# Patient Record
Sex: Male | Born: 1965 | Race: White | Hispanic: No | Marital: Married | Smoking: Never smoker
Health system: Southern US, Community
[De-identification: ages and names within clinical notes are randomized; demographics above are authoritative.]

## PROBLEM LIST (undated history)

## (undated) DIAGNOSIS — T7840XA Allergy, unspecified, initial encounter: Secondary | ICD-10-CM

## (undated) DIAGNOSIS — E785 Hyperlipidemia, unspecified: Secondary | ICD-10-CM

## (undated) DIAGNOSIS — J45909 Unspecified asthma, uncomplicated: Secondary | ICD-10-CM

## (undated) DIAGNOSIS — K200 Eosinophilic esophagitis: Secondary | ICD-10-CM

## (undated) DIAGNOSIS — K219 Gastro-esophageal reflux disease without esophagitis: Secondary | ICD-10-CM

## (undated) DIAGNOSIS — J189 Pneumonia, unspecified organism: Secondary | ICD-10-CM

## (undated) HISTORY — DX: Allergy, unspecified, initial encounter: T78.40XA

## (undated) HISTORY — PX: VASECTOMY: SHX75

## (undated) HISTORY — DX: Gastro-esophageal reflux disease without esophagitis: K21.9

## (undated) HISTORY — DX: Pneumonia, unspecified organism: J18.9

## (undated) HISTORY — DX: Unspecified asthma, uncomplicated: J45.909

## (undated) HISTORY — DX: Hyperlipidemia, unspecified: E78.5

## (undated) HISTORY — PX: UPPER GASTROINTESTINAL ENDOSCOPY: SHX188

## (undated) HISTORY — DX: Eosinophilic esophagitis: K20.0

---

## 2009-11-06 ENCOUNTER — Ambulatory Visit: Payer: Self-pay | Admitting: Family Medicine

## 2009-11-06 DIAGNOSIS — M2170 Unequal limb length (acquired), unspecified site: Secondary | ICD-10-CM

## 2009-11-06 DIAGNOSIS — M25569 Pain in unspecified knee: Secondary | ICD-10-CM | POA: Insufficient documentation

## 2009-11-06 DIAGNOSIS — M629 Disorder of muscle, unspecified: Secondary | ICD-10-CM

## 2010-10-13 ENCOUNTER — Ambulatory Visit: Payer: Self-pay | Admitting: Sports Medicine

## 2011-01-05 NOTE — Assessment & Plan Note (Signed)
Summary: IT BAND PAIN,MC   Vital Signs:  Patient profile:   45 year old male Height:      73 inches Weight:      164 pounds BMI:     21.72 BP sitting:   122 / 80  Vitals Entered By: Lillia Pauls CMA (October 13, 2010 1:54 PM)   History of Present Illness: 45yo male to office w/ c/o R ITB pain. Started 09/25/10 while doing a speed workout, supposed to do 6-miles, but legs started tightening after 3 miles.  Stopped b/c gait was changing. Continues to run - running up to 6.5 miles (11/1) but was getting tight, averaging 3-mile runs at most.  Running 9-10 miles/wk presently, about 20-25 miles/wk prior to injury. Doing some ITB stretches recommended to him last year, but only started recently. He has decerased running pace & is trying to run on more level surfaces. Not taking any medications for this. Training for 1/2 marathon in [place] that is this saturday. Similar ITB pain last year & was fitted with sports insoles with lift in R shoe for leg-length difference.  Still using insoles. Denies numbness/tingling. Denies any mechanical symptoms in knees.  Preventive Screening-Counseling & Management  Alcohol-Tobacco     Smoking Status: never  Allergies (verified): No Known Drug Allergies  Social History: Smoking Status:  never  Review of Systems      See HPI  Physical Exam  General:  Well-developed,well-nourished,in no acute distress; alert,appropriate and cooperative throughout examination Msk:  KNEE: - R knee: no gross deformity.  No swelling or effusion. TTP along distal & middle ITB.  No TTP along medial/lateral joint lines, patella, patellar tendon, quad tendon. Ligaments stable.  Neg McMurrays. Neg patellar apprehension & patellar grind. (+)Ober test with tight ITB - L knee exam unremarkable, no tenderness or tightness along ITB  HIPS:  good ROM b/l.  Good hip strength b/l.  GAIT:  R leg 1.5 cm shorter than left.  Running gait reveals forefoot striker, overall  good running form.  No limp.  Neurovascularly intact distally.   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  Given recurrance of his symptoms, would benefit from [facility] of these exercises when feeling better - Foam roller before & after workouts to help loosen area - Ice massage after workouts - Ok to continue running as long as not limping or changing gait.  Gradually increase distance & intensity of runs.  Recommend running on level surfaces - f/u if not improving, otherwise prn  Orders: Sports Insoles (L3510) Foot Orthosis ( Arch Strap/Heel Cup) [phone])  Problem # 2:  UNEQUAL LEG LENGTH (ICD-736.81)  - R leg 1.5 cm short - unchanged from previous encounters - Fitted with new set of sports insoles with heel lift in R shoe.  Cont to use these in all of his shoes.  Orders: Sports Insoles [phone]) Foot Orthosis ( Arch Strap/Heel Cup) [phone])   Orders Added: 1)  Est. Patient Level III [95621] 2)  Sports Insoles [L3510] 3)  Foot Orthosis ( Arch Strap/Heel Cup) [H0865]

## 2012-01-11 ENCOUNTER — Ambulatory Visit (INDEPENDENT_AMBULATORY_CARE_PROVIDER_SITE_OTHER): Payer: Managed Care, Other (non HMO) | Admitting: Sports Medicine

## 2012-01-11 VITALS — BP 110/70

## 2012-01-11 DIAGNOSIS — M629 Disorder of muscle, unspecified: Secondary | ICD-10-CM

## 2012-01-11 DIAGNOSIS — M2170 Unequal limb length (acquired), unspecified site: Secondary | ICD-10-CM

## 2012-01-11 MED ORDER — KETOPROFEN POWD: Status: DC

## 2012-01-11 NOTE — Assessment & Plan Note (Addendum)
Ketoprofen gel topically TID as well as massaged ice TID. Will also place in compression sleeve to ITB area to help with swelling. Will follow up in 6 weeks if not better.  In observing his right it seems that we have been slightly overcorrected with the lift on the right sports insole.  I changed the test by placing a tapered lift and we placed the thicker portion to the outside of the foot. Running gait in this shows less supination of his right foot.  We will try this correction to see if it helps.

## 2012-01-11 NOTE — Progress Notes (Signed)
  Subjective:    Patient ID: Gabriel Greer, male    DOB: [birth year], 46 y.o.   MRN: [id number]  HPI Patient presents today with chief complaint of recurrence of the iliotibial band syndrome on the right. Patient states that he [place] approximately 2 weeks ago and has noticed severe lateral knee pain since but not too much during the marathon. Pt states that he resumed running 3-4 days after marathon and was unable to run >2 miles without developing severe lateral knee pain. Pt was previously able to run >10 miles at a time without pain prior to marathon.  Pt does report some subtle R medial hip pain associated with training for marathon. This has since resolved per pt. No referred back or ankle pain per pt.  Pt has a baseline hx/o of leg length discepancy that he wears corrected sports insole for. Pt states that he used these orthotics during his recent marathon.   Review of Systems See HPI, otherwise ROS negative     Objective:   Physical Exam NAD  knee exam shows no effusion; stable ligaments; negative Mcmurray's and provocative meniscal tests; crepitation on patellar compression; patellar tendon unremarkable. Noted mild swelling and TTP along lateral insertion of iliotibial band at gerdy's tubercle.  Hip with full ROM bilaterally, no noted pain with internal/external rotation of hips bilaterally.  Noted leg discrepancy of approx 1.5 cm (L>R) Noted mild R foot supination with running gait.   Excellent overall hip and quad strength       Assessment & Plan:

## 2012-01-11 NOTE — Assessment & Plan Note (Addendum)
R leg affected side. Orthotic padding corrected to help with foot supination.   Use tapered lift this time to correct supination and increase aboput 1 cm

## 2012-01-11 NOTE — Patient Instructions (Signed)
Continue with the knee and hip exercises previously given Wear the knee sleeve daily Rub ice over the iliotibial band daily  Rub the ketoprofen gel over the iliotibial band 2-3 times per day If your symptoms dont improve over the next 6 weeks, come back in for a follow up appointment.   Doree Albee MD

## 2014-01-29 ENCOUNTER — Ambulatory Visit (INDEPENDENT_AMBULATORY_CARE_PROVIDER_SITE_OTHER): Payer: Managed Care, Other (non HMO) | Admitting: Family Medicine

## 2014-01-29 ENCOUNTER — Ambulatory Visit: Payer: Managed Care, Other (non HMO)

## 2014-01-29 VITALS — BP 116/82 | HR 94 | Temp 97.4°F | Resp 18 | Ht 73.5 in | Wt 190.6 lb

## 2014-01-29 DIAGNOSIS — R0989 Other specified symptoms and signs involving the circulatory and respiratory systems: Secondary | ICD-10-CM

## 2014-01-29 DIAGNOSIS — R6889 Other general symptoms and signs: Secondary | ICD-10-CM

## 2014-01-29 DIAGNOSIS — K219 Gastro-esophageal reflux disease without esophagitis: Secondary | ICD-10-CM

## 2014-01-29 LAB — POCT CBC
Granulocyte percent: 64.9 %G (ref 37–80)
HEMATOCRIT: 50.9 % (ref 43.5–53.7)
HEMOGLOBIN: 16.2 g/dL (ref 14.1–18.1)
LYMPH, POC: 1.9 (ref 0.6–3.4)
MCH: 28.1 pg (ref 27–31.2)
MCHC: 31.8 g/dL (ref 31.8–35.4)
MCV: 88.4 fL (ref 80–97)
MID (cbc): 0.4 (ref 0–0.9)
MPV: 7.8 fL (ref 0–99.8)
POC Granulocyte: 4.3 (ref 2–6.9)
POC LYMPH PERCENT: 28.7 %L (ref 10–50)
POC MID %: 6.4 %M (ref 0–12)
Platelet Count, POC: 236 10*3/uL (ref 142–424)
RBC: 5.76 M/uL (ref 4.69–6.13)
RDW, POC: 13.6 %
WBC: 6.7 10*3/uL (ref 4.6–10.2)

## 2014-01-29 MED ORDER — OMEPRAZOLE 20 MG PO CPDR: 20.0000 mg | DELAYED_RELEASE_CAPSULE | Freq: Every day | ORAL | Status: DC

## 2014-01-29 NOTE — Progress Notes (Signed)
Subjective:    Patient ID: Gabriel Greer, male    DOB: 1966-06-23, 48 y.o.   MRN: [id number]  01/29/2014  feels like a gas bubble in throat   HPI This 48 y.o. male presents for evaluation of pressure/gas bubble in throat while eating.  Two months ago, was eating grilled chicken salad at lunch; with first bite of salad, felt severe pressure sensation in throat that felt like it was suffocating patient.  Duration of symptoms ten minutes.  Had frequent belching during episode with improvement in symptoms.  Chicken did not get stuck. Was having difficulty breathing, talking, swallowing.    Had similar more severe episode tonight. Had just started eating steak with acute onset of severe pressure in throat that again felt like it was suffocating patient. Wife and children witnessed event tonight.  Duration of symptoms one hour.  Frequent belching again occurred; pt also spit up frothy white clear liquid.  Upon arriving to the clinic, walking around seemed to help symptoms subside. Now asymptomatic for the past thirty minutes.  Denies fever/chills/sweats.  No headache, ear pain, sore throat.  No rhinorrhea; does suffer with chronic nasal congestion and PND; does not take anything for chronic congestion.  No tongue swelling, facial swelling, or throat swelling.  No cough.  No n/v/d/c; no abdominal pain. No frequent GERD, heartburn, indigestion.  No associated chest pain, neck pain, arm pain. No focal weakness, paresthesias, slurred speech, difficulties with fine motor skills. No family history of early CAD disease.  Wife does report major work stressors.  Symptoms could be stress related.  Was previously running three days per week but has recently stopped exercising.     Review of Systems  Constitutional: Negative for fever, chills, diaphoresis, activity change, appetite change, fatigue and unexpected weight change.  HENT: Positive for congestion, postnasal drip and trouble swallowing. Negative for ear  pain, facial swelling, sore throat and voice change.   Respiratory: Positive for choking and shortness of breath. Negative for cough, wheezing and stridor.   Cardiovascular: Negative for chest pain, palpitations and leg swelling.  Gastrointestinal: Negative for vomiting, abdominal pain, diarrhea, constipation and abdominal distention.  Psychiatric/Behavioral: Negative for dysphoric mood. The patient is not nervous/anxious.     Past Medical History  Diagnosis Date  . Allergy   . Asthma    No Known Allergies Current Outpatient Prescriptions  Medication Sig Dispense Refill  . omeprazole (PRILOSEC) 20 MG capsule Take 1 capsule (20 mg total) by mouth daily.  30 capsule  3   No current facility-administered medications for this visit.   History   Social History  . Marital Status: Married    Spouse Name: N/A    Number of Children: N/A  . Years of Education: N/A   Occupational History  . Not on file.   Social History Main Topics  . Smoking status: Never Smoker   . Smokeless tobacco: Not on file  . Alcohol Use: No  . Drug Use: No  . Sexual Activity: Not on file   Other Topics Concern  . Not on file   Social History Narrative  . No narrative on file   Family History  Problem Relation Age of Onset  . Hyperlipidemia Mother   . Hypertension Mother   . Hyperlipidemia Father   . Hypertension Father   . Mental illness Father        Objective:    BP 116/82  Pulse 94  Temp(Src) 97.4 F (36.3 C) (Oral)  Resp 18  Ht 6' 1.5" (1.867 m)  Wt 190 lb 9.6 oz (86.456 kg)  BMI 24.80 kg/m2  SpO2 99% Physical Exam  Constitutional: He is oriented to person, place, and time. He appears well-developed and well-nourished. No distress.  HENT:  Head: Normocephalic and atraumatic.  Right Ear: External ear normal.  Left Ear: External ear normal.  Nose: Nose normal.  Mouth/Throat: Oropharynx is clear and moist.  Eyes: Conjunctivae and EOM are normal. Pupils are equal, round, and  reactive to light.  Neck: Normal range of motion. Neck supple. No JVD present. No tracheal deviation present. No thyromegaly present.  Cardiovascular: Normal rate, regular rhythm and normal heart sounds.  Exam reveals no gallop and no friction rub.   No murmur heard. Pulmonary/Chest: Effort normal and breath sounds normal. No stridor. No respiratory distress. He has no wheezes. He has no rales.  Abdominal: Soft. Bowel sounds are normal. He exhibits no distension and no mass. There is no tenderness. There is no rebound and no guarding.  Musculoskeletal:       Right shoulder: Normal.       Left shoulder: Normal.       Cervical back: Normal.  Lymphadenopathy:    He has no cervical adenopathy.  Neurological: He is alert and oriented to person, place, and time. He has normal reflexes. No cranial nerve deficit. He exhibits normal muscle tone. Coordination normal.  Skin: Skin is warm and dry. No rash noted. He is not diaphoretic.  Psychiatric: He has a normal mood and affect. His behavior is normal.   Results for orders placed in visit on 01/29/14  POCT CBC      Result Value Ref Range   WBC 6.7  4.6 - 10.2 K/uL   Lymph, poc 1.9  0.6 - 3.4   POC LYMPH PERCENT 28.7  10 - 50 %L   MID (cbc) 0.4  0 - 0.9   POC MID % 6.4  0 - 12 %M   POC Granulocyte 4.3  2 - 6.9   Granulocyte percent 64.9  37 - 80 %G   RBC 5.76  4.69 - 6.13 M/uL   Hemoglobin 16.2  14.1 - 18.1 g/dL   HCT, POC 16.1  09.6 - 53.7 %   MCV 88.4  80 - 97 fL   MCH, POC 28.1  27 - 31.2 pg   MCHC 31.8  31.8 - 35.4 g/dL   RDW, POC 04.5     Platelet Count, POC 236  142 - 424 K/uL   MPV 7.8  0 - 99.8 fL   [facility] reading (PRIMARY) by  Dr. Katrinka Blazing.  CXR: NAD  Soft tissue neck: NAD   RANITIDINE 150MG  PO ADMINISTERED IN OFFICE.    Assessment & Plan:  Choking sensation - Plan: DG Chest 2 View, DG Neck Soft Tissue, POCT CBC, Comprehensive metabolic panel, Ambulatory referral to Gastroenterology  Esophageal reflux  1. Choking/suffocation  sensation with eating:  New; two episodes in the past two months; associated with reflux/belching, attempts at vomiting.  Denies dysphagia or food getting stuck.  Ddx atypical presentation of GERD, esophageal spasm, candidiasis esophageal, stress.  Refer to GI due to recurrence and to rule out pathology.  Rx for Omeprazole 20mg  every morning. Advised to also start Zantac 150mg  qhs.  To ED for recurrence. 2. GERD:  New.  Rx for Omeprazole 20mg  provided to take until evaluated by GI.  Also recommend adding Zantac 150mg  qhs.  Meds ordered this encounter  Medications  . omeprazole (PRILOSEC) 20  MG capsule    Sig: Take 1 capsule (20 mg total) by mouth daily.    Dispense:  30 capsule    Refill:  3    No Follow-up on file.   Nilda SimmerKristi Jesicca Dipierro, M.D.  [facility]  [place] [address] [place], [place]  [id number] [phone] phone [phone] fax

## 2014-01-29 NOTE — Patient Instructions (Signed)
1. Take Prilosec one every morning 30 minutes to 1 hour before eating. 2. Take Zantac/Ranitidine 150mg  one tablet at bedtime.    Gastroesophageal Reflux Disease, Adult Gastroesophageal reflux disease (GERD) happens when acid from your stomach flows up into the esophagus. When acid comes in contact with the esophagus, the acid causes soreness (inflammation) in the esophagus. Over time, GERD may create small holes (ulcers) in the lining of the esophagus. CAUSES   Increased body weight. This puts pressure on the stomach, making acid rise from the stomach into the esophagus.  Smoking. This increases acid production in the stomach.  Drinking alcohol. This causes decreased pressure in the lower esophageal sphincter (valve or ring of muscle between the esophagus and stomach), allowing acid from the stomach into the esophagus.  Late evening meals and a full stomach. This increases pressure and acid production in the stomach.  A malformed lower esophageal sphincter. Sometimes, no cause is found. SYMPTOMS   Burning pain in the lower part of the mid-chest behind the breastbone and in the mid-stomach area. This may occur twice a week or more often.  Trouble swallowing.  Sore throat.  Dry cough.  Asthma-like symptoms including chest tightness, shortness of breath, or wheezing. DIAGNOSIS  Your caregiver may be able to diagnose GERD based on your symptoms. In some cases, X-rays and other tests may be done to check for complications or to check the condition of your stomach and esophagus. TREATMENT  Your caregiver may recommend over-the-counter or prescription medicines to help decrease acid production. Ask your caregiver before starting or adding any new medicines.  HOME CARE INSTRUCTIONS   Change the factors that you can control. Ask your caregiver for guidance concerning weight loss, quitting smoking, and alcohol consumption.  Avoid foods and drinks that make your symptoms worse, such  as:  Caffeine or alcoholic drinks.  Chocolate.  Peppermint or mint flavorings.  Garlic and onions.  Spicy foods.  Citrus fruits, such as oranges, lemons, or limes.  Tomato-based foods such as sauce, chili, salsa, and pizza.  Fried and fatty foods.  Avoid lying down for the 3 hours prior to your bedtime or prior to taking a nap.  Eat small, frequent meals instead of large meals.  Wear loose-fitting clothing. Do not wear anything tight around your waist that causes pressure on your stomach.  Raise the head of your bed 6 to 8 inches with wood blocks to help you sleep. Extra pillows will not help.  Only take over-the-counter or prescription medicines for pain, discomfort, or fever as directed by your caregiver.  Do not take aspirin, ibuprofen, or other nonsteroidal anti-inflammatory drugs (NSAIDs). SEEK IMMEDIATE MEDICAL CARE IF:   You have pain in your arms, neck, jaw, teeth, or back.  Your pain increases or changes in intensity or duration.  You develop nausea, vomiting, or sweating (diaphoresis).  You develop shortness of breath, or you faint.  Your vomit is green, yellow, black, or looks like coffee grounds or blood.  Your stool is red, bloody, or black. These symptoms could be signs of other problems, such as heart disease, gastric bleeding, or esophageal bleeding. MAKE SURE YOU:   Understand these instructions.  Will watch your condition.  Will get help right away if you are not doing well or get worse. Document Released: 09/01/2005 Document Revised: 02/14/2012 Document Reviewed: 06/11/2011 [facility] Patient Information 2014 [employer] [place].

## 2014-01-30 LAB — COMPREHENSIVE METABOLIC PANEL
ALT: 40 U/L (ref 0–53)
AST: 31 U/L (ref 0–37)
Albumin: 3.7 g/dL (ref 3.5–5.2)
Alkaline Phosphatase: 49 U/L (ref 39–117)
BILIRUBIN TOTAL: 0.5 mg/dL (ref 0.2–1.2)
BUN: 19 mg/dL (ref 6–23)
CALCIUM: 8.9 mg/dL (ref 8.4–10.5)
CHLORIDE: 105 meq/L (ref 96–112)
CO2: 24 meq/L (ref 19–32)
CREATININE: 0.95 mg/dL (ref 0.50–1.35)
GLUCOSE: 98 mg/dL (ref 70–99)
Potassium: 3.8 mEq/L (ref 3.5–5.3)
Sodium: 138 mEq/L (ref 135–145)
Total Protein: 6.5 g/dL (ref 6.0–8.3)

## 2014-02-18 ENCOUNTER — Encounter: Payer: Self-pay | Admitting: Gastroenterology

## 2014-03-28 ENCOUNTER — Encounter: Payer: Self-pay | Admitting: Gastroenterology

## 2014-03-28 ENCOUNTER — Ambulatory Visit (INDEPENDENT_AMBULATORY_CARE_PROVIDER_SITE_OTHER): Payer: Managed Care, Other (non HMO) | Admitting: Gastroenterology

## 2014-03-28 VITALS — BP 120/80 | HR 79 | Ht 73.5 in | Wt 190.0 lb

## 2014-03-28 DIAGNOSIS — R1310 Dysphagia, unspecified: Secondary | ICD-10-CM

## 2014-03-28 DIAGNOSIS — G4733 Obstructive sleep apnea (adult) (pediatric): Secondary | ICD-10-CM | POA: Insufficient documentation

## 2014-03-28 NOTE — Progress Notes (Signed)
_                                                                                                                History of Present Illness: Pleasant 48 year old white male referred for evaluation of dysphagia.  On at least 2 occasions in the last 4 months he has had an episode of acute onset of  fullness in his upper chest with swallowing solids that may last up to 15 minutes.  On each occasion it was preceded by taking a swallow of cold water.  In between episodes he has been well and denies dysphagia, odynophagia, or pyrosis.  He describes this feeling as an intense pressure in his upper chest that subsides spontaneously but occurs immediately with swallowing.  When the develops the symptoms he has not attempted to use or drink.  He may have some shortness of breath when these symptoms occur and also subsided within 10-15 minutes.  He denies abdominal pain.    Past Medical History  Diagnosis Date  . Allergy   . Asthma   . GERD (gastroesophageal reflux disease)   . Hyperlipidemia   . Pneumonia    Past Surgical History  Procedure Laterality Date  . Vasectomy     family history includes Hyperlipidemia in his father and mother; Hypertension in his father and mother; Mental illness in his father. Current Outpatient Prescriptions  Medication Sig Dispense Refill  . omeprazole (PRILOSEC) 20 MG capsule Take 1 capsule (20 mg total) by mouth daily.  30 capsule  3  . ranitidine (ZANTAC) 150 MG tablet Take 150 mg by mouth daily.       No current facility-administered medications for this visit.   Allergies as of 03/28/2014  . (No Known Allergies)    reports that he has never smoked. He has never used smokeless tobacco. He reports that he does not drink alcohol or use illicit drugs.     Review of Systems: His wife tells him that he snores very loudly.  He claims to awaken at night sometimes gasping for breath and with a rapid pulse.  Pertinent positive and negative review  of systems were noted in the above HPI section. All other review of systems were otherwise negative.  Vital signs were reviewed in today's medical record Physical Exam: General: Well developed , well nourished, no acute distress Skin: anicteric Head: Normocephalic and atraumatic Eyes:  sclerae anicteric, EOMI Ears: Normal auditory acuity Mouth: No deformity or lesions Neck: Supple, no masses or thyromegaly Lungs: Clear throughout to auscultation Heart: Regular rate and rhythm; no murmurs, rubs or bruits Abdomen: Soft, non tender and non distended. No masses, hepatosplenomegaly or hernias noted. Normal Bowel sounds Rectal:deferred Musculoskeletal: Symmetrical with no gross deformities  Skin: No lesions on visible extremities Pulses:  Normal pulses noted Extremities: No clubbing, cyanosis, edema or deformities noted Neurological: Alert oriented x 4, grossly nonfocal Cervical Nodes:  No significant cervical adenopathy Inguinal Nodes: No significant inguinal adenopathy Psychological:  Alert and cooperative. Normal mood and affect  See Assessment and Plan under Problem List

## 2014-03-28 NOTE — Patient Instructions (Signed)
You have been scheduled for an endoscopy with propofol. Please follow written instructions given to you at your visit today. If you use inhalers (even only as needed), please bring them with you on the day of your procedure. Your physician has requested that you go to www.startemmi.com and enter the access code given to you at your visit today. This web site gives a general overview about your procedure. However, you should still follow specific instructions given to you by our office regarding your preparation for the procedure. Your appointment with Pulmonary is scheduled on :  Dr Shelle Ironlance on May 18th at 3:45pm arrive at 3:30pm

## 2014-03-28 NOTE — Assessment & Plan Note (Signed)
Symptoms of intense snoring followed by awakening short of breath raises the question of obstructive sleep apnea.  Recommendations #1 pulmonary referral

## 2014-03-28 NOTE — Assessment & Plan Note (Signed)
The patient's episodes raise the question of dysphagia due to either a fixed stricture or esophageal spasm.  Zenker's diverticulum is less likely since symptoms are very intermittent.  Recommendations #1 upper endoscopy with dilation as indicated

## 2014-04-03 ENCOUNTER — Encounter: Payer: Self-pay | Admitting: Gastroenterology

## 2014-04-03 ENCOUNTER — Ambulatory Visit (AMBULATORY_SURGERY_CENTER): Payer: Managed Care, Other (non HMO) | Admitting: Gastroenterology

## 2014-04-03 VITALS — BP 129/75 | HR 79 | Temp 96.8°F | Resp 14 | Ht 73.5 in | Wt 190.0 lb

## 2014-04-03 DIAGNOSIS — K200 Eosinophilic esophagitis: Secondary | ICD-10-CM

## 2014-04-03 DIAGNOSIS — K222 Esophageal obstruction: Secondary | ICD-10-CM

## 2014-04-03 DIAGNOSIS — R1310 Dysphagia, unspecified: Secondary | ICD-10-CM

## 2014-04-03 MED ORDER — SODIUM CHLORIDE 0.9 % IV SOLN: 500.0000 mL | INTRAVENOUS | Status: DC

## 2014-04-03 NOTE — Progress Notes (Signed)
Knute NeuJ Mullins CRNA is aware of pt's egg allergy

## 2014-04-03 NOTE — Patient Instructions (Addendum)

## 2014-04-03 NOTE — Op Note (Signed)
[facility] [address] [place], [id number]   ENDOSCOPY PROCEDURE REPORT  PATIENT: Gabriel MollyCox, Gabriel T.  MR#: [id number] BIRTHDATE: 07-11-1966 , 47  yrs. old GENDER: Male ENDOSCOPIST: Louis Meckelobert D Kaplan, MD REFERRED BY: PROCEDURE DATE:  04/03/2014 PROCEDURE:  EGD w/ biopsy ASA CLASS:     Class II INDICATIONS:  Dysphagia. MEDICATIONS: MAC sedation, administered by CRNA, Versed 5 mg IV, Fentanyl 100 mcg IV, and Simethicone 0.6cc PO TOPICAL ANESTHETIC: Cetacaine Spray  DESCRIPTION OF PROCEDURE: After the risks benefits and alternatives of the procedure were thoroughly explained, informed consent was obtained.  The LB [id number] endoscope was introduced through the mouth and advanced to the third portion of the duodenum. Without limitations.  The instrument was slowly withdrawn as the mucosa was fully examined.      There was a moderate stricture at the GE junction.  The 9 mm gastroscope easily traversed the stricture. Throughout the esophagus there appeared to be multiple longitudinal for furrows.  Circular rings were seen as well.  Biopsies were taken throughout the esophagus to rule out eosinophilic esophagitis.   The remainder of the upper endoscopy exam was otherwise normal.  Retroflexed views revealed no abnormalities. The scope was then withdrawn from the patient and the procedure completed.  COMPLICATIONS: There were no complications. ENDOSCOPIC IMPRESSION: 1.   esophageal stricture 2.  possible eosinophilic esophagitis  RECOMMENDATIONS: await biopsy findings Continue omeprazole REPEAT EXAM:  eSigned:  Louis Meckelobert D Kaplan, MD 04/03/2014 2:10 PM   CC:

## 2014-04-03 NOTE — Progress Notes (Signed)
Called to room to assist during endoscopic procedure.  Patient ID and intended procedure confirmed with present staff. Received instructions for my participation in the procedure from the performing physician.  

## 2014-04-04 ENCOUNTER — Telehealth: Payer: Self-pay

## 2014-04-04 NOTE — Telephone Encounter (Signed)
Left a message on the pt's answering machine to call us back if any questions or concerns #[phone]. maw

## 2014-04-09 ENCOUNTER — Telehealth: Payer: Self-pay | Admitting: Gastroenterology

## 2014-04-09 ENCOUNTER — Other Ambulatory Visit: Payer: Self-pay

## 2014-04-09 MED ORDER — FLUTICASONE PROPIONATE HFA 220 MCG/ACT IN AERO: 2.0000 | INHALATION_SPRAY | Freq: Four times a day (QID) | RESPIRATORY_TRACT | Status: DC

## 2014-04-09 NOTE — Telephone Encounter (Signed)
Pt calling for path results from EGD. Please advise.

## 2014-04-09 NOTE — Telephone Encounter (Signed)
See result note.  

## 2014-04-09 NOTE — Telephone Encounter (Signed)
I just reviewed his pathology this morning.  Please see result note

## 2014-04-10 ENCOUNTER — Telehealth: Payer: Self-pay | Admitting: Gastroenterology

## 2014-04-10 NOTE — Telephone Encounter (Signed)
Pt called and wants to know if he still needs to continue taking the Prilosec. Please advise.

## 2014-04-11 NOTE — Telephone Encounter (Signed)
Spoke with pt and he is aware. 

## 2014-04-11 NOTE — Telephone Encounter (Signed)
Yes, continue Prilosec. I believe you sent in a prescription for fluticasone

## 2014-04-22 ENCOUNTER — Institutional Professional Consult (permissible substitution): Payer: Managed Care, Other (non HMO) | Admitting: Pulmonary Disease

## 2014-05-08 ENCOUNTER — Encounter: Payer: Self-pay | Admitting: Gastroenterology

## 2014-05-08 ENCOUNTER — Ambulatory Visit (INDEPENDENT_AMBULATORY_CARE_PROVIDER_SITE_OTHER): Payer: Managed Care, Other (non HMO) | Admitting: Gastroenterology

## 2014-05-08 VITALS — BP 120/70 | HR 68 | Ht 72.75 in | Wt 182.2 lb

## 2014-05-08 DIAGNOSIS — K200 Eosinophilic esophagitis: Secondary | ICD-10-CM

## 2014-05-08 DIAGNOSIS — G4733 Obstructive sleep apnea (adult) (pediatric): Secondary | ICD-10-CM

## 2014-05-08 NOTE — Assessment & Plan Note (Signed)
Patient has had an excellent response to fluticasone.    Recommendations #1 decrease fluticasone to twice a day #2 referred to allergist for food allergy testing #3 continue Prilosec and discontinue Zantac

## 2014-05-08 NOTE — Progress Notes (Signed)
          History of Present Illness:  Mr. Glawson has eosinophilic esophagitis.  He is taking fluticasone 2-4 times a day in addition to Prilosec and Zantac.  Dysphagia has entirely resolved.  He denies pyrosis.  He is unaware of any food allergies.    Review of Systems: Pertinent positive and negative review of systems were noted in the above HPI section. All other review of systems were otherwise negative.    Current Medications, Allergies, Past Medical History, Past Surgical History, Family History and Social History were reviewed in [employer] [facility] electronic medical record  Vital signs were reviewed in today's medical record. Physical Exam: General: Well developed , well nourished, no acute distress  See Assessment and Plan under Problem List

## 2014-05-08 NOTE — Patient Instructions (Signed)
Follow up in 3 months  eosinophilic esophagitis is your diagnosis

## 2014-05-08 NOTE — Assessment & Plan Note (Signed)
Based on symptoms of excessive snoring I have referred him to pulmonary for testing

## 2014-05-28 ENCOUNTER — Encounter: Payer: Self-pay | Admitting: Family Medicine

## 2014-05-28 DIAGNOSIS — R1310 Dysphagia, unspecified: Secondary | ICD-10-CM

## 2014-07-20 ENCOUNTER — Other Ambulatory Visit: Payer: Self-pay | Admitting: Family Medicine

## 2014-07-23 ENCOUNTER — Other Ambulatory Visit: Payer: Self-pay | Admitting: Family Medicine

## 2014-07-24 ENCOUNTER — Telehealth: Payer: Self-pay | Admitting: Gastroenterology

## 2014-07-24 NOTE — Telephone Encounter (Signed)
Dr Arlyce DiceKAplan Please advise

## 2014-07-25 MED ORDER — OMEPRAZOLE 20 MG PO CPDR: 20.0000 mg | DELAYED_RELEASE_CAPSULE | Freq: Every day | ORAL | Status: DC

## 2014-07-25 NOTE — Telephone Encounter (Signed)
Called patient to inform med sent to his pharmacy

## 2014-07-25 NOTE — Telephone Encounter (Signed)
yes

## 2015-04-20 ENCOUNTER — Other Ambulatory Visit: Payer: Self-pay | Admitting: Gastroenterology

## 2015-08-12 ENCOUNTER — Other Ambulatory Visit: Payer: Self-pay | Admitting: Gastroenterology

## 2015-08-12 NOTE — Telephone Encounter (Signed)
Dr Arlyce Dice- Patient requests refills on fluticasone. Patient has been on this since 05/2014 for E.E. Shall I continue to refill? If so, please indicate how often you would like this to be taken (there are a couple sets of directions in system for patient)

## 2015-08-13 NOTE — Telephone Encounter (Signed)
Left message for patient to call back. He has been scheduled for an appointment with Dr Arlyce Dice 10/02/15.

## 2015-08-13 NOTE — Telephone Encounter (Signed)
Take 2 puffs to swallow twice a day.  Do not eat or drink for one half hour after each dose. He needs an office visit next 6-8 weeks

## 2015-10-02 ENCOUNTER — Ambulatory Visit: Payer: Managed Care, Other (non HMO) | Admitting: Gastroenterology

## 2015-11-08 ENCOUNTER — Encounter (HOSPITAL_COMMUNITY): Payer: Self-pay | Admitting: Emergency Medicine

## 2015-11-08 ENCOUNTER — Emergency Department (HOSPITAL_COMMUNITY)
Admission: EM | Admit: 2015-11-08 | Discharge: 2015-11-09 | Disposition: A | Discharge status: Home / Self Care | Payer: Managed Care, Other (non HMO) | Attending: Emergency Medicine | Admitting: Emergency Medicine

## 2015-11-08 DIAGNOSIS — R110 Nausea: Secondary | ICD-10-CM | POA: Diagnosis present

## 2015-11-08 DIAGNOSIS — K219 Gastro-esophageal reflux disease without esophagitis: Secondary | ICD-10-CM | POA: Insufficient documentation

## 2015-11-08 DIAGNOSIS — R079 Chest pain, unspecified: Secondary | ICD-10-CM | POA: Diagnosis not present

## 2015-11-08 DIAGNOSIS — T7840XA Allergy, unspecified, initial encounter: Secondary | ICD-10-CM | POA: Insufficient documentation

## 2015-11-08 DIAGNOSIS — Z8701 Personal history of pneumonia (recurrent): Secondary | ICD-10-CM | POA: Diagnosis not present

## 2015-11-08 DIAGNOSIS — X58XXXA Exposure to other specified factors, initial encounter: Secondary | ICD-10-CM | POA: Insufficient documentation

## 2015-11-08 DIAGNOSIS — Z7952 Long term (current) use of systemic steroids: Secondary | ICD-10-CM | POA: Diagnosis not present

## 2015-11-08 DIAGNOSIS — Y929 Unspecified place or not applicable: Secondary | ICD-10-CM | POA: Diagnosis not present

## 2015-11-08 DIAGNOSIS — Z8639 Personal history of other endocrine, nutritional and metabolic disease: Secondary | ICD-10-CM | POA: Diagnosis not present

## 2015-11-08 DIAGNOSIS — R109 Unspecified abdominal pain: Secondary | ICD-10-CM | POA: Diagnosis not present

## 2015-11-08 DIAGNOSIS — Y939 Activity, unspecified: Secondary | ICD-10-CM | POA: Diagnosis not present

## 2015-11-08 DIAGNOSIS — Z7951 Long term (current) use of inhaled steroids: Secondary | ICD-10-CM | POA: Diagnosis not present

## 2015-11-08 DIAGNOSIS — J45909 Unspecified asthma, uncomplicated: Secondary | ICD-10-CM | POA: Diagnosis not present

## 2015-11-08 DIAGNOSIS — Z79899 Other long term (current) drug therapy: Secondary | ICD-10-CM | POA: Insufficient documentation

## 2015-11-08 DIAGNOSIS — Y999 Unspecified external cause status: Secondary | ICD-10-CM | POA: Diagnosis not present

## 2015-11-08 LAB — CBC
HEMATOCRIT: 47.2 % (ref 39.0–52.0)
HEMOGLOBIN: 16.3 g/dL (ref 13.0–17.0)
MCH: 27.8 pg (ref 26.0–34.0)
MCHC: 34.5 g/dL (ref 30.0–36.0)
MCV: 80.5 fL (ref 78.0–100.0)
Platelets: 172 10*3/uL (ref 150–400)
RBC: 5.86 MIL/uL — AB (ref 4.22–5.81)
RDW: 13 % (ref 11.5–15.5)
WBC: 7 10*3/uL (ref 4.0–10.5)

## 2015-11-08 LAB — COMPREHENSIVE METABOLIC PANEL
ALBUMIN: 3.6 g/dL (ref 3.5–5.0)
ALT: 27 U/L (ref 17–63)
ANION GAP: 8 (ref 5–15)
AST: 32 U/L (ref 15–41)
Alkaline Phosphatase: 55 U/L (ref 38–126)
BUN: 22 mg/dL — ABNORMAL HIGH (ref 6–20)
CHLORIDE: 104 mmol/L (ref 101–111)
CO2: 26 mmol/L (ref 22–32)
Calcium: 9 mg/dL (ref 8.9–10.3)
Creatinine, Ser: 1.26 mg/dL — ABNORMAL HIGH (ref 0.61–1.24)
GFR calc non Af Amer: >60 mL/min (ref >60)
GLUCOSE: 110 mg/dL — AB (ref 65–99)
POTASSIUM: 3.9 mmol/L (ref 3.5–5.1)
SODIUM: 138 mmol/L (ref 135–145)
Total Bilirubin: 0.7 mg/dL (ref 0.3–1.2)
Total Protein: 7.1 g/dL (ref 6.5–8.1)

## 2015-11-08 MED ORDER — PANTOPRAZOLE SODIUM 40 MG PO TBEC
40.0000 mg | DELAYED_RELEASE_TABLET | Freq: Once | ORAL | Status: AC
  Administered 2015-11-08: 40 mg via ORAL
  Filled 2015-11-08: qty 1

## 2015-11-08 MED ORDER — PREDNISONE 20 MG PO TABS
40.0000 mg | ORAL_TABLET | Freq: Once | ORAL | Status: AC
  Administered 2015-11-08: 40 mg via ORAL
  Filled 2015-11-08: qty 2

## 2015-11-08 MED ORDER — HYDROMORPHONE HCL 1 MG/ML IJ SOLN: 1.0000 mg | Freq: Once | INTRAMUSCULAR | Status: DC

## 2015-11-08 NOTE — ED Notes (Signed)
Pt. reports nausea and throat " pressure" this evening after eating food with corn ingredients , pt. suspects allergic reaction took 2 Benadryl prior to arrival , respirations unlabored , no oral swelling / airway intact .

## 2015-11-08 NOTE — ED Provider Notes (Signed)
CSN: [id number]     Arrival date & time 11/08/15  2025 History   First MD Initiated Contact with Patient 11/08/15 2121     Chief Complaint  Patient presents with  . Nausea     (Consider location/radiation/quality/duration/timing/severity/associated sxs/prior Treatment) The history is provided by the patient and medical records. No language interpreter was used.   Gabriel Greer is a 49 y.o. 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 Pt. States after eating this he felt pressure and burning in his throat/chest. Two benadryl were quickly taken which provided some relief, but still feels discomfort. Denies shortness of breath, drooling. He is tolerating PO.    Past Medical History  Diagnosis Date  . Allergy   . GERD (gastroesophageal reflux disease)   . Hyperlipidemia   . Pneumonia   . Asthma     as child  . Eosinophilic esophagitis    Past Surgical History  Procedure Laterality Date  . Vasectomy     Family History  Problem Relation Age of Onset  . Hyperlipidemia Mother   . Hypertension Mother   . Hyperlipidemia Father   . Hypertension Father   . Mental illness Father   . Colon cancer Neg Hx   . Esophageal cancer Neg Hx   . Rectal cancer Neg Hx   . Stomach cancer Neg Hx    Social History  Substance Use Topics  . Smoking status: Never Smoker   . Smokeless tobacco: Never Used  . Alcohol Use: Yes     Comment: very rarely    Review of Systems  Constitutional: Negative.   HENT: Negative for congestion, rhinorrhea and sore throat.   Eyes: Negative for visual disturbance.  Respiratory: Negative for cough, shortness of breath and wheezing.   Cardiovascular: Positive for chest pain (Burning). Negative for palpitations and leg swelling.  Gastrointestinal: Positive for nausea and abdominal pain. Negative for vomiting, diarrhea and constipation.  Endocrine:  Negative for polydipsia and polyuria.  Musculoskeletal: Negative for myalgias, back pain, arthralgias and neck pain.  Skin: Negative for rash.  Neurological: Negative for dizziness, weakness and headaches.      Allergies  Corn-containing products; Eggs or egg-derived products; and Barley grass  Home Medications   Prior to Admission medications   Medication Sig Start Date End Date Taking? Authorizing Provider  fluticasone (FLOVENT HFA) 220 MCG/ACT inhaler Swallow 2 puffs twice daily. Do not eat/drink for 30 minutes afterwards Patient taking differently: Inhale 1 puff into the lungs at bedtime. Do not eat/drink for 30 minutes afterwards 08/13/15  Yes Louis Meckel, MD  omeprazole (PRILOSEC) 20 MG capsule Take 1 capsule (20 mg total) by mouth daily. PATIENT NEEDS OFFICE VISIT FOR ADDITIONAL REFILLS 07/25/14  Yes Louis Meckel, MD  predniSONE (DELTASONE) 20 MG tablet Take 2 tablets (40 mg total) by mouth daily. 11/09/15   Niyam Bisping Pilcher Calub Tarnow, PA-C   BP 139/86 mmHg  Pulse 78  Temp(Src) 97.7 F (36.5 C) (Oral)  Resp 16  Ht  (1.854 m)  Wt 85.9 kg  BMI 24.99 kg/m2  SpO2 94% Physical Exam  Constitutional: He is oriented to person, place, and time. He appears well-developed and well-nourished.  Alert and in no acute distress  HENT:  Head: Normocephalic and atraumatic.  Cardiovascular: Normal rate, regular rhythm, normal heart sounds and intact distal pulses.  Exam reveals no gallop  and no friction rub.   No murmur heard. Pulmonary/Chest: Effort normal and breath sounds normal. No respiratory distress. He has no wheezes. He has no rales. He exhibits no tenderness.  Abdominal: He exhibits no mass. There is no rebound and no guarding.  Abdomen soft, non-tender, non-distended Bowel sounds positive in all four quadrants  Musculoskeletal: He exhibits no edema.  Neurological: He is alert and oriented to person, place, and time.  Skin: Skin is warm and dry. No rash noted.  Psychiatric:  He has a normal mood and affect. His behavior is normal. Judgment and thought content normal.  Nursing note and vitals reviewed.   ED Course  Procedures (including critical care time) Labs Review Labs Reviewed  COMPREHENSIVE METABOLIC PANEL - Abnormal; Notable for the following:    Glucose, Bld 110 (*)    BUN 22 (*)    Creatinine, Ser 1.26 (*)    All other components within normal limits  CBC - Abnormal; Notable for the following:    RBC 5.86 (*)    All other components within normal limits    Imaging Review No results found. I have personally reviewed and evaluated these images and lab results as part of my medical decision-making.   EKG Interpretation None      MDM   Final diagnoses:  Allergic reaction, initial encounter  Gabriel Greer presents after an allergic reaction to cornmeal. Aware of allergy. Patient experienced facial flushing after steroids, informed this was a normal reaction - no tachycardia, no throat swelling, no sob, no drooling. 11:17 PM - Patient re-evaluated, states he is much improved. Sxs almost completely resolved, but not quite. Will continue to observe.  12:23 AM - Patient re-evaluated, feels back to baseline. Will dc home with steroid burst x 4 days. Pt. Has appointment with PCP on Tuesday - told to keep and discuss today's visit. Has epi-pen at home. Discharge instructions discussed and questions answered.      [facility] Center LLCJaime Pilcher Ezri Landers, PA-C 11/09/15 0024  Melene Planan Floyd, DO 11/09/15 1539

## 2015-11-09 MED ORDER — PREDNISONE 20 MG PO TABS: 40.0000 mg | ORAL_TABLET | Freq: Every day | ORAL | Status: DC

## 2015-11-09 NOTE — Discharge Instructions (Signed)
Take prednisone daily for the next four days, continue usual home medications Please keep your appointment with your primary doctor on Tuesday for discussion of your diagnoses and further evaluation after today's visit; Please return to the ER for shortness of breath, any new or worsening symptoms, any additional concerns

## 2015-11-10 ENCOUNTER — Encounter (HOSPITAL_COMMUNITY): Payer: Self-pay | Admitting: Emergency Medicine

## 2015-11-10 ENCOUNTER — Emergency Department (HOSPITAL_COMMUNITY)
Admission: EM | Admit: 2015-11-10 | Discharge: 2015-11-11 | Disposition: A | Discharge status: Home / Self Care | Payer: Managed Care, Other (non HMO) | Attending: Emergency Medicine | Admitting: Emergency Medicine

## 2015-11-10 ENCOUNTER — Telehealth: Payer: Self-pay | Admitting: Gastroenterology

## 2015-11-10 DIAGNOSIS — Z8639 Personal history of other endocrine, nutritional and metabolic disease: Secondary | ICD-10-CM | POA: Insufficient documentation

## 2015-11-10 DIAGNOSIS — Z8701 Personal history of pneumonia (recurrent): Secondary | ICD-10-CM | POA: Diagnosis not present

## 2015-11-10 DIAGNOSIS — K200 Eosinophilic esophagitis: Secondary | ICD-10-CM

## 2015-11-10 DIAGNOSIS — J45909 Unspecified asthma, uncomplicated: Secondary | ICD-10-CM | POA: Diagnosis not present

## 2015-11-10 DIAGNOSIS — K219 Gastro-esophageal reflux disease without esophagitis: Secondary | ICD-10-CM | POA: Insufficient documentation

## 2015-11-10 LAB — I-STAT TROPONIN, ED: Troponin i, poc: 0 ng/mL (ref 0.00–0.08)

## 2015-11-10 LAB — CBC WITH DIFFERENTIAL/PLATELET
BASOS ABS: 0 10*3/uL (ref 0.0–0.1)
BASOS PCT: 0 %
Eosinophils Absolute: 0 10*3/uL (ref 0.0–0.7)
Eosinophils Relative: 0 %
HEMATOCRIT: 46.7 % (ref 39.0–52.0)
HEMOGLOBIN: 16.1 g/dL (ref 13.0–17.0)
Lymphocytes Relative: 16 %
Lymphs Abs: 1.4 10*3/uL (ref 0.7–4.0)
MCH: 27.7 pg (ref 26.0–34.0)
MCHC: 34.5 g/dL (ref 30.0–36.0)
MCV: 80.2 fL (ref 78.0–100.0)
MONOS PCT: 5 %
Monocytes Absolute: 0.4 10*3/uL (ref 0.1–1.0)
NEUTROS ABS: 6.7 10*3/uL (ref 1.7–7.7)
NEUTROS PCT: 79 %
Platelets: 172 10*3/uL (ref 150–400)
RBC: 5.82 MIL/uL — AB (ref 4.22–5.81)
RDW: 13.1 % (ref 11.5–15.5)
WBC: 8.5 10*3/uL (ref 4.0–10.5)

## 2015-11-10 LAB — BASIC METABOLIC PANEL
ANION GAP: 10 (ref 5–15)
BUN: 29 mg/dL — ABNORMAL HIGH (ref 6–20)
CHLORIDE: 104 mmol/L (ref 101–111)
CO2: 22 mmol/L (ref 22–32)
Calcium: 9 mg/dL (ref 8.9–10.3)
Creatinine, Ser: 1.22 mg/dL (ref 0.61–1.24)
GFR calc non Af Amer: >60 mL/min (ref >60)
Glucose, Bld: 105 mg/dL — ABNORMAL HIGH (ref 65–99)
POTASSIUM: 4 mmol/L (ref 3.5–5.1)
Sodium: 136 mmol/L (ref 135–145)

## 2015-11-10 NOTE — Telephone Encounter (Signed)
Spoke with the patient. He feels a little better today. He still has a sensation of food and liquids not wanting to pass through the esophagus. He wants to burp, but cannot. He is afraid to eat or drink because of this. He was given a prednisone burst, but did not take the prednisone yesterday. He has been on a PPI for a long time and has continued this.  Discussed soft diet, bland foods and medication. The patient will take his prednisone, but has decided he will only take 10 mg today and tomorrow. Take prednisone with a meal. He will continue the omeprazole 20 mg ac breakfast. He may repeat the omeprazole at hs if he wants to. Keep his appointment tomorrow as already scheduled. ER if he acutely worsens.

## 2015-11-10 NOTE — ED Notes (Signed)
Pt. reports throat pressure this evening unrelieved by Prilosec , denies nausea or vomitting /respirations unlabored.

## 2015-11-11 ENCOUNTER — Telehealth: Payer: Self-pay | Admitting: Gastroenterology

## 2015-11-11 ENCOUNTER — Emergency Department (HOSPITAL_COMMUNITY): Payer: Managed Care, Other (non HMO)

## 2015-11-11 ENCOUNTER — Encounter: Payer: Self-pay | Admitting: Gastroenterology

## 2015-11-11 ENCOUNTER — Ambulatory Visit (INDEPENDENT_AMBULATORY_CARE_PROVIDER_SITE_OTHER): Payer: Managed Care, Other (non HMO) | Admitting: Gastroenterology

## 2015-11-11 VITALS — BP 120/70 | HR 72 | Ht 72.75 in | Wt 184.0 lb

## 2015-11-11 DIAGNOSIS — K200 Eosinophilic esophagitis: Secondary | ICD-10-CM | POA: Diagnosis not present

## 2015-11-11 DIAGNOSIS — R1310 Dysphagia, unspecified: Secondary | ICD-10-CM

## 2015-11-11 MED ORDER — FLUTICASONE PROPIONATE HFA 220 MCG/ACT IN AERO: 2.0000 | INHALATION_SPRAY | Freq: Two times a day (BID) | RESPIRATORY_TRACT | Status: DC

## 2015-11-11 MED ORDER — SODIUM CHLORIDE 0.9 % IV BOLUS (SEPSIS)
2000.0000 mL | Freq: Once | INTRAVENOUS | Status: AC
  Administered 2015-11-11: 2000 mL via INTRAVENOUS

## 2015-11-11 MED ORDER — LANSOPRAZOLE 30 MG PO TBDP: 30.0000 mg | ORAL_TABLET | Freq: Two times a day (BID) | ORAL | Status: DC

## 2015-11-11 MED ORDER — LANSOPRAZOLE 30 MG PO CPDR: 30.0000 mg | DELAYED_RELEASE_CAPSULE | Freq: Two times a day (BID) | ORAL | Status: DC

## 2015-11-11 NOTE — ED Provider Notes (Signed)
CSN: [id number]     Arrival date & time 11/10/15  2202 History  By signing my name below, I, Gabriel Greer, attest that this documentation has been prepared under the direction and in the presence of Azalia Bilis, MD. Electronically Signed: Octavia Heir, ED Scribe. 11/11/2015. 12:13 AM.    No chief complaint on file.     The history is provided by the patient. No language interpreter was used.   HPI Comments: Gabriel Greer is a 49 y.o. male who has a PMHx of Eosinophilic esophagitis presents to the Emergency Department complaining of intermittent, moderate, gradual worsening throat pressure onset yesterday. He states he has been having some throat pressure that worsens with food and he has been unable to keep fluids down. Pt was seen by the ED on 12/3 for an allergic reaction after eating a slice a pizza. He receive prednisone and Prilosec and notes no relief. Pt says the Prilosec makes his throat pressure acute onset. He has an appointment with his doctor tomorrow for his symptoms. He denies nausea, and vomiting.   Past Medical History  Diagnosis Date  . Allergy   . GERD (gastroesophageal reflux disease)   . Hyperlipidemia   . Pneumonia   . Asthma     as child  . Eosinophilic esophagitis    Past Surgical History  Procedure Laterality Date  . Vasectomy     Family History  Problem Relation Age of Onset  . Hyperlipidemia Mother   . Hypertension Mother   . Hyperlipidemia Father   . Hypertension Father   . Mental illness Father   . Colon cancer Neg Hx   . Esophageal cancer Neg Hx   . Rectal cancer Neg Hx   . Stomach cancer Neg Hx    Social History  Substance Use Topics  . Smoking status: Never Smoker   . Smokeless tobacco: Never Used  . Alcohol Use: Yes     Comment: very rarely    Review of Systems  A complete 10 system review of systems was obtained and all systems are negative except as noted in the HPI and PMH.    Allergies  Corn-containing products; Eggs or  egg-derived products; and Barley grass  Home Medications   Prior to Admission medications   Medication Sig Start Date End Date Taking? Authorizing Provider  fluticasone (FLOVENT HFA) 220 MCG/ACT inhaler Swallow 2 puffs twice daily. Do not eat/drink for 30 minutes afterwards Patient taking differently: Inhale 1 puff into the lungs at bedtime. Do not eat/drink for 30 minutes afterwards 08/13/15   Louis Meckel, MD  omeprazole (PRILOSEC) 20 MG capsule Take 1 capsule (20 mg total) by mouth daily. PATIENT NEEDS OFFICE VISIT FOR ADDITIONAL REFILLS 07/25/14   Louis Meckel, MD  predniSONE (DELTASONE) 20 MG tablet Take 2 tablets (40 mg total) by mouth daily. 11/09/15   Chase Picket Ward, PA-C   Triage vitals: BP 132/92 mmHg  Pulse 93  Temp(Src) 97.5 F (36.4 C) (Oral)  Resp 16  SpO2 100% Physical Exam  Constitutional: He is oriented to person, place, and time. He appears well-developed and well-nourished.  HENT:  Head: Normocephalic and atraumatic.  Eyes: EOM are normal.  Neck: Normal range of motion. Neck supple. No JVD present. No tracheal deviation present. No thyromegaly present.  Cardiovascular: Normal rate, regular rhythm, normal heart sounds and intact distal pulses.   Pulmonary/Chest: Effort normal and breath sounds normal. No stridor. No respiratory distress.  Abdominal: Soft. He exhibits no distension. There is  no tenderness.  Musculoskeletal: Normal range of motion.  Lymphadenopathy:    He has no cervical adenopathy.  Neurological: He is alert and oriented to person, place, and time.  Skin: Skin is warm and dry.  Psychiatric: He has a normal mood and affect. Judgment normal.  Nursing note and vitals reviewed.   ED Course  Procedures  DIAGNOSTIC STUDIES: Oxygen Saturation is 100% on RA, normal by my interpretation.  COORDINATION OF CARE:  12:39 AM Discussed treatment plan which includes fluids & CXR with pt at bedside and pt agreed to plan.  Labs Review Labs Reviewed   CBC WITH DIFFERENTIAL/PLATELET - Abnormal; Notable for the following:    RBC 5.82 (*)    All other components within normal limits  BASIC METABOLIC PANEL - Abnormal; Notable for the following:    Glucose, Bld 105 (*)    BUN 29 (*)    All other components within normal limits  I-STAT TROPOININ, ED    Imaging Review No results found. I have personally reviewed and evaluated these images and lab results as part of my medical decision-making.   EKG Interpretation   Date/Time:  Monday November 10 2015 22:34:08 EST Ventricular Rate:  85 PR Interval:  118 QRS Duration: 88 QT Interval:  382 QTC Calculation: 454 R Axis:   79 Text Interpretation:  Normal sinus rhythm Left ventricular hypertrophy T  wave abnormality, consider inferior ischemia Abnormal ECG No old tracing  to compare Confirmed by GOLDSTON  MD, SCOTT ([id number]) on 11/10/2015 10:34:12  PM      MDM   Final diagnoses:  None    3:02 AM Patient is scheduled to see gastroenterology later in the morning.  Patient was hydrated with IV fluids in the emergency department.  Workup in the ER is without significant abnormality.  Doubt ACS.  His issue seems to be more esophageal in nature.  Given his history of eosinophilic esophagitis this likely is an exacerbation of that.  He does not want any additional steroids at this time.  Will defer to his primary GI team tomorrow  I personally performed the services described in this documentation, which was scribed in my presence. The recorded information has been reviewed and is accurate.      Azalia BilisKevin Dawnyel Leven, MD 11/11/15 [phone]

## 2015-11-11 NOTE — Telephone Encounter (Signed)
Called patient to inform that capsules was sent  In to pharmacy

## 2015-11-11 NOTE — Progress Notes (Signed)
Gabriel Greer    [id number]    01/22/1966  Primary Care Physician: Duane Lopeoss, Alan, MD  Referring Physician: Gildardo Crankerharles Ross, MD [address] [place], [place] [address]  Chief complaint:  Dysphagia  HPI:  49 year old white male with eosinophilic esophagitis diagnosed in April 2015 here with complaints of difficulty swallowing and chest pressure when he tries to drink or eat anything since last Saturday.  He has chronic allergy and thinks it all started after he ate pizza crust with cornmeal, he presented to the ER was given prednisone 40 mg. Patient his heart rate and blood pressure went up after he took prednisone and did not feel any difference in how he felt in terms of pressure when he swallows. He is been only on liquid diet and applesauce for the past 4 days. He is able to swallow his saliva and is not regurgitating up fluids. He feels there is a gas bubble in the chest and whenever he tries to swallow it just stays and puts pressure on his chest.  He is been on Prilosec once a day along with Flovent 2 puffs once a day for the past one and half year. He was doing well until about a month or 2 ago when he started noticing some difficulty in swallowing. His last EGD was in April 2015 which showed stricture at GE junction and features suggestive of eosinophilic esophagitis  Outpatient Encounter Prescriptions as of 11/11/2015  Medication Sig  . [DISCONTINUED] fluticasone (FLOVENT HFA) 220 MCG/ACT inhaler Swallow 2 puffs twice daily. Do not eat/drink for 30 minutes afterwards (Patient taking differently: Inhale 1 puff into the lungs at bedtime. Do not eat/drink for 30 minutes afterwards)  . [DISCONTINUED] omeprazole (PRILOSEC) 20 MG capsule Take 1 capsule (20 mg total) by mouth daily. PATIENT NEEDS OFFICE VISIT FOR ADDITIONAL REFILLS  . [DISCONTINUED] predniSONE (DELTASONE) 20 MG tablet Take 2 tablets (40 mg total) by mouth daily.  . fluticasone (FLOVENT HFA) 220 MCG/ACT inhaler  Inhale 2 puffs into the lungs 2 (two) times daily.  . lansoprazole (PREVACID SOLUTAB) 30 MG disintegrating tablet Take 1 tablet (30 mg total) by mouth 2 (two) times daily.   No facility-administered encounter medications on file as of 11/11/2015.    Allergies as of 11/11/2015 - Review Complete 11/11/2015  Allergen Reaction Noted  . Corn-containing products Other (See Comments) 11/08/2015  . Eggs or egg-derived products Swelling 04/03/2014  . Barley grass Other (See Comments) 11/08/2015    Past Medical History  Diagnosis Date  . Allergy   . GERD (gastroesophageal reflux disease)   . Hyperlipidemia   . Pneumonia   . Asthma     as child  . Eosinophilic esophagitis     Past Surgical History  Procedure Laterality Date  . Vasectomy      Family History  Problem Relation Age of Onset  . Hyperlipidemia Mother   . Hypertension Mother   . Hyperlipidemia Father   . Hypertension Father   . Mental illness Father   . Colon cancer Neg Hx   . Esophageal cancer Neg Hx   . Rectal cancer Neg Hx   . Stomach cancer Neg Hx     Social History   Social History  . Marital Status: Married    Spouse Name: N/A  . Number of Children: 2  . Years of Education: N/A   Occupational History  . Licensed conveyancerenvironmental engineer    Social History Main Topics  .  Smoking status: Never Smoker   . Smokeless tobacco: Never Used  . Alcohol Use: Yes     Comment: very rarely  . Drug Use: No  . Sexual Activity: Not on file   Other Topics Concern  . Not on file   Social History Narrative      Review of systems: Review of Systems  Constitutional: Negative for fever and chills.  HENT: Negative.   Eyes: Negative for blurred vision.  Respiratory: Negative for cough, shortness of breath and wheezing.   Cardiovascular: Negative for chest pain and palpitations.  Gastrointestinal: as per HPI Genitourinary: Negative for dysuria, urgency, frequency and hematuria.  Musculoskeletal: Negative for myalgias, back  pain and joint pain.  Skin: Negative for itching and rash.  Neurological: Negative for dizziness, tremors, focal weakness, seizures and loss of consciousness.  Endo/Heme/Allergies: Negative for environmental allergies.  Psychiatric/Behavioral: Negative for depression, suicidal ideas and hallucinations.  All other systems reviewed and are negative.   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 The 9 mm gastroscope easily traversed the stricture. Throughout the esophagus there appeared to be multiple longitudinal for furrows. Circular rings were seen as well.   Assessment and Plan/Recommendations:  49 year old male with eosinophilic esophagitis here with complaints of dysphagia Switch to Prevacid SolTab 30 mg twice a day Advised patient to take Flovent 222 puffs twice a day and based on findings on EGD may have to consider increasing the dose of Flovent or starting budesonide slurry Schedule for EGD with possible dilation We'll also obtain a barium swallow Return in 2 months  K. Scherry Ran , MD [phone] Mon-Fri 8a-5p [phone] after 5p, weekends, holidays

## 2015-11-11 NOTE — Patient Instructions (Addendum)
You have been scheduled for an endoscopy. Please follow written instructions given to you at your visit today. If you use inhalers (even only as needed), please bring them with you on the day of your procedure. Your physician has requested that you go to www.startemmi.com and enter the access code given to you at your visit today. This web site gives a general overview about your procedure. However, you should still follow specific instructions given to you by our office regarding your preparation for the procedure.  You have been [facility] of the [facility]) on  11/20/2015 at 9:30am . Please arrive 15 minutes prior to your appointment for registration. Make certain not to have anything to eat or drink 6 hours prior to your test. If you need to reschedule for any reason, please contact radiology at [phone] to do so. __________________________________________________________________ A barium swallow is an examination that concentrates on views of the esophagus. This tends to be a double contrast exam (barium and two liquids which, when combined, create a gas to distend the wall of the oesophagus) or single contrast (non-ionic iodine based). The study is usually tailored to your symptoms so a good history is essential. Attention is paid during the study to the form, structure and configuration of the esophagus, looking for functional disorders (such as aspiration, dysphagia, achalasia, motility and reflux) EXAMINATION You may be asked to change into a gown, depending on the type of swallow being performed. A radiologist and radiographer will perform the procedure. The radiologist will advise you of the type of contrast selected for your procedure and direct you during the exam. You will be asked to stand, sit or lie in several different positions and to hold a small amount of fluid in your mouth before being asked to swallow while the imaging is  performed .In some instances you may be asked to swallow barium coated marshmallows to assess the motility of a solid food bolus. The exam can be recorded as a digital or video fluoroscopy procedure. POST PROCEDURE It will take 1-2 days for the barium to pass through your system. To facilitate this, it is important, unless otherwise directed, to increase your fluids for the next 24-48hrs and to resume your normal diet.  This test typically takes about 30 minutes to perform. __________________________________________________________________________________  We have sent your prescriptions to your pharmacy   Your follow up appointment is scheduled on 01/20/2016 at 10:30am

## 2015-11-12 ENCOUNTER — Ambulatory Visit (AMBULATORY_SURGERY_CENTER): Payer: Managed Care, Other (non HMO) | Admitting: Gastroenterology

## 2015-11-12 ENCOUNTER — Encounter: Payer: Self-pay | Admitting: Gastroenterology

## 2015-11-12 VITALS — BP 126/85 | HR 101 | Temp 95.9°F | Resp 12

## 2015-11-12 DIAGNOSIS — R1310 Dysphagia, unspecified: Secondary | ICD-10-CM | POA: Diagnosis not present

## 2015-11-12 DIAGNOSIS — K200 Eosinophilic esophagitis: Secondary | ICD-10-CM

## 2015-11-12 LAB — GLUCOSE, CAPILLARY
GLUCOSE-CAPILLARY: 58 mg/dL — AB (ref 65–99)
GLUCOSE-CAPILLARY: 55 mg/dL — AB (ref 65–99)
GLUCOSE-CAPILLARY: 61 mg/dL — AB (ref 65–99)
Glucose-Capillary: 55 mg/dL — ABNORMAL LOW (ref 65–99)
Glucose-Capillary: 68 mg/dL (ref 65–99)

## 2015-11-12 MED ORDER — SODIUM CHLORIDE 0.9 % IV SOLN: 500.0000 mL | INTRAVENOUS | Status: DC

## 2015-11-12 NOTE — Progress Notes (Signed)
A/ox3 pleased with MAC, report to Karen RN 

## 2015-11-12 NOTE — Patient Instructions (Signed)
CONTINUE YOUR PPI TWICE DAILY. FLOVENT 220, 2 PUFFS TWICE DAILY.     YOU HAD AN ENDOSCOPIC PROCEDURE TODAY AT THE [facility]:   Refer to the procedure report that was given to you for any specific questions about what was found during the examination.  If the procedure report does not answer your questions, please call your gastroenterologist to clarify.  If you requested that your care partner not be given the details of your procedure findings, then the procedure report has been included in a sealed envelope for you to review at your convenience later.  YOU SHOULD EXPECT: Some feelings of bloating in the abdomen. Passage of more gas than usual.  Walking can help get rid of the air that was put into your GI tract during the procedure and reduce the bloating. If you had a lower endoscopy (such as a colonoscopy or flexible sigmoidoscopy) you may notice spotting of blood in your stool or on the toilet paper. If you underwent a bowel prep for your procedure, you may not have a normal bowel movement for a few days.  Please Note:  You might notice some irritation and congestion in your nose or some drainage.  This is from the oxygen used during your procedure.  There is no need for concern and it should clear up in a day or so.  SYMPTOMS TO REPORT IMMEDIATELY:  Following upper endoscopy (EGD)  Vomiting of blood or coffee ground material  New chest pain or pain under the shoulder blades  Painful or persistently difficult swallowing  New shortness of breath  Fever of 100F or higher  Black, tarry-looking stools  For urgent or emergent issues, a gastroenterologist can be reached at any hour by calling [phone].   DIET: Your first meal following the procedure should be a small meal and then it is ok to progress to your normal diet. Heavy or fried foods are harder to digest and may make you feel nauseous or bloated.  Likewise, meals heavy in dairy and vegetables can increase  bloating.  Drink plenty of fluids but you should avoid alcoholic beverages for 24 hours.  ACTIVITY:  You should plan to take it easy for the rest of today and you should NOT DRIVE or use heavy machinery until tomorrow (because of the sedation medicines used during the test).    FOLLOW UP: Our staff will call the number listed on your records the next business day following your procedure to check on you and address any questions or concerns that you may have regarding the information given to you following your procedure. If we do not reach you, we will leave a message.  However, if you are feeling well and you are not experiencing any problems, there is no need to return our call.  We will assume that you have returned to your regular daily activities without incident.  If any biopsies were taken you will be contacted by phone or by letter within the next 1-3 weeks.  Please call us at [phone] if you have not heard about the biopsies in 3 weeks.    SIGNATURES/CONFIDENTIALITY: You and/or your care partner have signed paperwork which will be entered into your electronic medical record.  These signatures attest to the fact that that the information above on your After Visit Summary has been reviewed and is understood.  Full responsibility of the confidentiality of this discharge information lies with you and/or your care-partner.

## 2015-11-12 NOTE — Progress Notes (Signed)
Called to room to assist during endoscopic procedure.  Patient ID and intended procedure confirmed with present staff. Received instructions for my participation in the procedure from the performing physician.  

## 2015-11-12 NOTE — Op Note (Addendum)
[facility] [address] [place], [id number]   ENDOSCOPY PROCEDURE REPORT  PATIENT: Gabriel Greer, Gabriel Greer  MR#: [id number] BIRTHDATE: [birth year] , 49  yrs. old GENDER: male ENDOSCOPIST: Marsa ArisKavitha Krissy Orebaugh, MD REFERRED BY:  Duane LopeAlan Ross, M.D. PROCEDURE DATE:  11/12/2015 PROCEDURE:  EGD w/ biopsy ASA CLASS:     Class II INDICATIONS:  dysphagia and surveillance. MEDICATIONS: Fentanyl 100 mcg IV and Versed 9 mg IV TOPICAL ANESTHETIC: Cetacaine Spray  DESCRIPTION OF PROCEDURE: After the risks benefits and alternatives of the procedure were thoroughly explained, informed consent was obtained.  The LB [id number] endoscope was introduced through the mouth and advanced to the second portion of the duodenum , Without limitations.  The instrument was slowly withdrawn as the mucosa was fully examined.    Esophageal rings and furrows consistent with eosinophilic esophagitis, moderate stricture in the distal esophagus able to traverse with the 9.5 mm scope without difficulty.  Random biopsies were obtained from proximal esophagus and distal esophagus. Squamocolumnar junction at 40 cm from incisors, regular Z line. Gastric mucosa appeared normal and duodenum appeared normal. Retroflexed views revealed as previously described.     The scope was then withdrawn from the patient and the procedure completed.  COMPLICATIONS: There were no immediate complications.  ENDOSCOPIC IMPRESSION: Esophageal rings and furrows consistent with eosinophilic esophagitis, moderate stricture in the distal esophagus able to traverse with the 9.5 mm scope without difficulty.  RECOMMENDATIONS: PPI twice daily Flovent 220, 2 puffs twice daily Repeat EGD in 6-8 weeks Follow up in office visit after EGD    eSigned:  Marsa ArisKavitha Larysa Pall, MD 11/12/2015 10:56 AM Revised: 11/12/2015 10:56 AM

## 2015-11-12 NOTE — Progress Notes (Signed)
[id number] Dr. Lavon PaganiniNandigam consulted, granular sugar PO and Gucagon 1mg  IV given

## 2015-11-12 NOTE — Progress Notes (Signed)
Revised report mailed to pt

## 2015-11-12 NOTE — Progress Notes (Signed)
Dental advisory given to patient 

## 2015-11-12 NOTE — Progress Notes (Signed)
Spoke with pharmacist, Windell Mouldinguth, at [facility].  I asked if Glucagon is contraindicated for those with corn allergies.  Windell MouldingRuth states Glucagon is ok to give to those with corn allergy

## 2015-11-12 NOTE — Progress Notes (Signed)
[id number] Dr. Lavon PaganiniNandigam wishes to reduce pt's anxiety, versed 2mg  IV ordered and given

## 2015-11-12 NOTE — Progress Notes (Signed)
PATIENT'S BLOOD SUGAR 68, ASYMPTOMATIC. DISCUSSED WITH DR. NANDIGAM. PATIENT CLEARED FOR DISCHARGE. PATIENT GIVEN WARM WATER TO DRINK. PATIENT ABLE TO SWALLOW WITHOUT DIFFICULTY.

## 2015-11-12 NOTE — Progress Notes (Signed)
Pt asked after I had checked him in, if I could check his blood sugar.  He stated, "I feel weak and jittery".  I asked Karl BalesKristen Westbrook, RN if okay to check pt's blood sugar.  She said yes it was ok.  Checked pt's sugar and it was 58.  Pt told me he was unable to have dextrose products due to corn allergy which caused his throat to swell.  We looked up on computer, by Karl BalesKristen Westbrook, RN and it recommended avoid all dextrose products.  Baxter HireKristen also called pharmacy, she spoke with Haze JustinVronda, Pharmacist.  Haze JustinVronda recommended not giving and dextrose product.  Dr. Sallyanne KusterNadigam was notified and she spoke with pt and recommended to give N. S. IV wide open drip, "pt looks dry".  Pt's sugar was rechecked at 08:05 and it was 67.  Per Dr. Lavon PaganiniNandigam no po fluid d/t pt difficulity swalloning and not be able tolerate the juice.  We will administer IV N.S. And recheck sugar at 08:25.  maw

## 2015-11-12 NOTE — Progress Notes (Addendum)
I sat with pt while the admitting area after J Nulty CRNA gave Versed as directed per Dr. Lavon PaganiniNandigam.  I did make pt's wife aware that we were delaying his procedure a little bit to give him some IV fluid before his procedure. I monitored his VS while in the admitting area.  VSS t/o time in admitting.  Blood sugar checked several times- 834- 61, 847-55, 852- 55. Second bag of normal saline hung.    Dr. Lavon PaganiniNandigam made aware of pt's blood sugar at 852 and states no need to do another blood sugar- pt will have procedure done next.   915- pt states he is still feeling "shaky, about the same as when he came in" Pt monitored entire time in the admitting area.

## 2015-11-12 NOTE — Progress Notes (Signed)
[id number] Discussed anesthetic with pt, all questions answered, appears satisfied, will proceed with Versed/Fentanyl for sedation

## 2015-11-13 ENCOUNTER — Encounter (HOSPITAL_COMMUNITY): Payer: Self-pay | Admitting: Emergency Medicine

## 2015-11-13 ENCOUNTER — Telehealth: Payer: Self-pay

## 2015-11-13 ENCOUNTER — Emergency Department (HOSPITAL_COMMUNITY)
Admission: EM | Admit: 2015-11-13 | Discharge: 2015-11-13 | Disposition: A | Discharge status: Home / Self Care | Payer: Managed Care, Other (non HMO) | Attending: Emergency Medicine | Admitting: Emergency Medicine

## 2015-11-13 ENCOUNTER — Telehealth: Payer: Self-pay | Admitting: Gastroenterology

## 2015-11-13 DIAGNOSIS — K219 Gastro-esophageal reflux disease without esophagitis: Secondary | ICD-10-CM | POA: Insufficient documentation

## 2015-11-13 DIAGNOSIS — F419 Anxiety disorder, unspecified: Secondary | ICD-10-CM | POA: Insufficient documentation

## 2015-11-13 DIAGNOSIS — Z8701 Personal history of pneumonia (recurrent): Secondary | ICD-10-CM | POA: Insufficient documentation

## 2015-11-13 DIAGNOSIS — Z8639 Personal history of other endocrine, nutritional and metabolic disease: Secondary | ICD-10-CM | POA: Diagnosis not present

## 2015-11-13 DIAGNOSIS — Z79899 Other long term (current) drug therapy: Secondary | ICD-10-CM | POA: Diagnosis not present

## 2015-11-13 DIAGNOSIS — R0602 Shortness of breath: Secondary | ICD-10-CM | POA: Diagnosis present

## 2015-11-13 DIAGNOSIS — J45901 Unspecified asthma with (acute) exacerbation: Secondary | ICD-10-CM | POA: Insufficient documentation

## 2015-11-13 DIAGNOSIS — Z7951 Long term (current) use of inhaled steroids: Secondary | ICD-10-CM | POA: Diagnosis not present

## 2015-11-13 DIAGNOSIS — R002 Palpitations: Secondary | ICD-10-CM | POA: Insufficient documentation

## 2015-11-13 LAB — CBC WITH DIFFERENTIAL/PLATELET
BASOS ABS: 0 10*3/uL (ref 0.0–0.1)
BASOS PCT: 1 %
Eosinophils Absolute: 0.1 10*3/uL (ref 0.0–0.7)
Eosinophils Relative: 2 %
HEMATOCRIT: 46.3 % (ref 39.0–52.0)
Hemoglobin: 16.1 g/dL (ref 13.0–17.0)
LYMPHS ABS: 1.9 10*3/uL (ref 0.7–4.0)
Lymphocytes Relative: 29 %
MCH: 27.9 pg (ref 26.0–34.0)
MCHC: 34.8 g/dL (ref 30.0–36.0)
MCV: 80.2 fL (ref 78.0–100.0)
Monocytes Absolute: 0.5 10*3/uL (ref 0.1–1.0)
Monocytes Relative: 8 %
NEUTROS ABS: 4.1 10*3/uL (ref 1.7–7.7)
NEUTROS PCT: 60 %
PLATELETS: 175 10*3/uL (ref 150–400)
RBC: 5.77 MIL/uL (ref 4.22–5.81)
RDW: 12.9 % (ref 11.5–15.5)
WBC: 6.6 10*3/uL (ref 4.0–10.5)

## 2015-11-13 LAB — BASIC METABOLIC PANEL
ANION GAP: 12 (ref 5–15)
BUN: 8 mg/dL (ref 6–20)
CALCIUM: 8.9 mg/dL (ref 8.9–10.3)
CO2: 22 mmol/L (ref 22–32)
CREATININE: 1.08 mg/dL (ref 0.61–1.24)
Chloride: 105 mmol/L (ref 101–111)
GFR calc Af Amer: >60 mL/min (ref >60)
GLUCOSE: 70 mg/dL (ref 65–99)
Potassium: 3.8 mmol/L (ref 3.5–5.1)
Sodium: 139 mmol/L (ref 135–145)

## 2015-11-13 MED ORDER — SODIUM CHLORIDE 0.9 % IV BOLUS (SEPSIS)
1000.0000 mL | Freq: Once | INTRAVENOUS | Status: AC
  Administered 2015-11-13: 1000 mL via INTRAVENOUS

## 2015-11-13 MED ORDER — LORAZEPAM 1 MG PO TABS: 1.0000 mg | ORAL_TABLET | Freq: Three times a day (TID) | ORAL | Status: DC | PRN

## 2015-11-13 MED ORDER — DIPHENHYDRAMINE HCL 50 MG/ML IJ SOLN
25.0000 mg | Freq: Once | INTRAMUSCULAR | Status: AC
  Administered 2015-11-13: 25 mg via INTRAVENOUS
  Filled 2015-11-13: qty 1

## 2015-11-13 MED ORDER — LORAZEPAM 2 MG/ML IJ SOLN: 1.0000 mg | Freq: Once | INTRAMUSCULAR | Status: DC

## 2015-11-13 NOTE — Telephone Encounter (Signed)
  Follow up Call-  Call back number 11/12/2015 04/03/2014  Post procedure Call Back phone  # [phone] hm [phone]  Permission to leave phone message No Yes     Patient questions:  Do you have a fever, pain , or abdominal swelling? No. Pain Score  0 *  Have you tolerated food without any problems? Yes.    Have you been able to return to your normal activities? Yes.    Do you have any questions about your discharge instructions: Diet   No. Medications  No. Follow up visit  No.  Do you have questions or concerns about your Care? No.  Actions: * If pain score is 4 or above: No action needed, pain <4.  I spoke with the pt's wife, Gabriel Greer.  She said her husband was better, "he ate yesterday and he did feel better".  No complaints noted. maw

## 2015-11-13 NOTE — Discharge Instructions (Signed)
Take benadryl 50 mg every 6 hrs as needed.   Call GI tomorrow to discuss medication changes.   See your doctor.  Take ativan as needed for anxiety.   Return to ER if you have worse anxiety, shortness of breath, palpitations.

## 2015-11-13 NOTE — ED Notes (Signed)
Pt from home with c/o reaction to prevacet with heart palpitations, difficulty breathing, and "jittery."  Pt has taken two benadryl with some relief but SOB is returning.  Pt states he may have had a first reaction this past Tues.  Pt family reports this is the 3rd time this week pt has been to the ED with allergic reactions.  Pt in NAD, A&O.

## 2015-11-13 NOTE — ED Provider Notes (Addendum)
CSN: [id number]     Arrival date & time 11/13/15  1449 History   First MD Initiated Contact with Patient 11/13/15 1806     Chief Complaint  Patient presents with  . Medication Reaction     (Consider location/radiation/quality/duration/timing/severity/associated sxs/prior Treatment) The history is provided by the patient.  Gabriel Greer is a 49 y.o. male hx of GERD, HL, eosinophilic esophagitis, here presenting with shortness of breath, possible allergic reaction. It started about 5 days ago when he had some pizza and he came into the ER and was diagnosed with allergic reaction. Started on prednisone, Benadryl with minimal relief. Came here 2 days ago and was evaluated. Patient had clinic appointment with Dr. Lavon Paganini from [facility] later that day. He had endoscopy yesterday and was diagnosed with eosinophilic esophagitis with moderate stricture. He was switched from prilosec to prevacid and was told to increase flonase. He took prevacid twice. Each time he takes it, he felt like he has shortness of breath and palpitations. Took two doses of benadryl today and it helped controlled his symptoms. Didn't feel that the pills are stuck in his esophagus. Denies chest pain or rash.    Past Medical History  Diagnosis Date  . Allergy   . GERD (gastroesophageal reflux disease)   . Hyperlipidemia   . Pneumonia   . Asthma     as child  . Eosinophilic esophagitis    Past Surgical History  Procedure Laterality Date  . Vasectomy    . Upper gastrointestinal endoscopy     Family History  Problem Relation Age of Onset  . Hyperlipidemia Mother   . Hypertension Mother   . Hyperlipidemia Father   . Hypertension Father   . Mental illness Father   . Esophageal cancer Neg Hx   . Rectal cancer Neg Hx   . Stomach cancer Neg Hx   . Colon cancer Neg Hx   . Colon polyps Cousin    Social History  Substance Use Topics  . Smoking status: Never Smoker   . Smokeless tobacco: Never Used  . Alcohol Use: Yes      Comment: very rarely    Review of Systems  Respiratory: Positive for shortness of breath.   Cardiovascular: Positive for palpitations.  All other systems reviewed and are negative.     Allergies  Corn-containing products; Eggs or egg-derived products; and Barley grass  Home Medications   Prior to Admission medications   Medication Sig Start Date End Date Taking? Authorizing Provider  diphenhydrAMINE (SOMINEX) 25 MG tablet Take 25 mg by mouth at bedtime as needed for itching or sleep.   Yes Historical Provider, MD  fluticasone (FLOVENT HFA) 220 MCG/ACT inhaler Inhale 2 puffs into the lungs 2 (two) times daily. 11/11/15  Yes Napoleon Form, MD  lansoprazole (PREVACID) 30 MG capsule Take 1 capsule (30 mg total) by mouth 2 (two) times daily before a meal. 11/11/15  Yes Kavitha Nandigam V, MD   BP 138/86 mmHg  Pulse 81  Temp(Src) 98.1 F (36.7 C)  Resp 26  SpO2 98% Physical Exam  Constitutional: He is oriented to person, place, and time.  Anxious   HENT:  Head: Normocephalic.  MM slightly dry   Eyes: Conjunctivae are normal. Pupils are equal, round, and reactive to light.  Neck: Normal range of motion. Neck supple.  Cardiovascular: Normal rate, regular rhythm and normal heart sounds.   Pulmonary/Chest: Effort normal and breath sounds normal. No respiratory distress. He has no wheezes. He has  no rales.  Abdominal: Soft. Bowel sounds are normal. He exhibits no distension. There is no tenderness. There is no rebound.  Musculoskeletal: Normal range of motion. He exhibits no edema or tenderness.  Neurological: He is alert and oriented to person, place, and time. No cranial nerve deficit. Coordination normal.  Skin: Skin is warm and dry.  Psychiatric: He has a normal mood and affect. His behavior is normal. Judgment and thought content normal.  Nursing note and vitals reviewed.   ED Course  Procedures (including critical care time) Labs Review Labs Reviewed  CBC WITH  DIFFERENTIAL/PLATELET  BASIC METABOLIC PANEL    Imaging Review No results found. I have personally reviewed and evaluated these images and lab results as part of my medical decision-making.   EKG Interpretation   Date/Time:  Thursday November 13 2015 18:24:06 EST Ventricular Rate:  73 PR Interval:  111 QRS Duration: 81 QT Interval:  411 QTC Calculation: 453 R Axis:   74 Text Interpretation:  Sinus rhythm Borderline short PR interval Borderline  repolarization abnormality No significant change since last tracing  Confirmed by Ranyia Witting  MD, Maddalena Linarez ([id number]) on 11/13/2015 7:24:57 PM      MDM   Final diagnoses:  None   Gabriel Greer is a 49 y.o. male here with possible medication reaction. I doubt that he has true allergic reaction to prevacid. Likely more anxiety associated with taking it. He came to the ED 3 times this week for similar complaints. Since he had eosinophilic esophagitis on EGD, I don't think starting prednisone is a good idea. I offered ativan but he declined. He hasn't been eating much this week so will check electrolytes. Will give benadryl for now. Will have him talk to his GI doctor about taking his PPIs. At this point, he doesn't want to take any PPI.   8:15 PM Felt better now after benadryl. Recommend benadryl 50 mg every 6 hrs as needed. Labs and EKG unremarkable. Of note, he is not tachycardic or hypotensive or hypoxic. PERC neg and I doubt PE. He should call GI tomorrow to discuss changing meds. Will give ativan prn anxiety.    Richardean Canalavid H Harriet Sutphen, MD 11/13/15 2016  Richardean Canalavid H Katilin Raynes, MD 11/13/15 2017

## 2015-11-13 NOTE — Telephone Encounter (Signed)
Spoke with Mrs. Gabriel Greer. Patient is talking in the background. The first dose pf Prevacid made him feel jittery and his heart race. Today after taking the prevacid, he reports feeling jittery , heart racing and he feels out of breath. No tingling or swelling of the lips or face. His wife states he looks normal to her, not flushed and no rash. Patient will not take anymore Prevacid. He will take Benadryl 50 mg "because that is what he does when he has a reaction". Reviewed reasons to seek emergency medical help.

## 2015-11-17 ENCOUNTER — Other Ambulatory Visit: Payer: Self-pay

## 2015-11-17 ENCOUNTER — Telehealth: Payer: Self-pay | Admitting: Gastroenterology

## 2015-11-17 MED ORDER — DEXLANSOPRAZOLE 60 MG PO CPDR: 60.0000 mg | DELAYED_RELEASE_CAPSULE | Freq: Every day | ORAL | Status: DC

## 2015-11-17 NOTE — Telephone Encounter (Signed)
He needs to be on a PPI for his condition along with Flovent.

## 2015-11-17 NOTE — Telephone Encounter (Signed)
Is there any other GI medication he can try that is not in the Prilosec/Prevacid family? Do you have any recommendations for his diet? He states he is losing weight fast, feels weak at times because of the low caloric intake. His food allergies prevent him from drinking most supplements.  He has the DG esophagus on 11/20/15.

## 2015-11-18 ENCOUNTER — Encounter (HOSPITAL_COMMUNITY): Payer: Self-pay

## 2015-11-18 ENCOUNTER — Emergency Department (HOSPITAL_COMMUNITY)
Admission: EM | Admit: 2015-11-18 | Discharge: 2015-11-19 | Disposition: A | Discharge status: Home / Self Care | Payer: Managed Care, Other (non HMO) | Attending: Emergency Medicine | Admitting: Emergency Medicine

## 2015-11-18 DIAGNOSIS — R200 Anesthesia of skin: Secondary | ICD-10-CM | POA: Diagnosis present

## 2015-11-18 DIAGNOSIS — Z79899 Other long term (current) drug therapy: Secondary | ICD-10-CM | POA: Insufficient documentation

## 2015-11-18 DIAGNOSIS — Z7951 Long term (current) use of inhaled steroids: Secondary | ICD-10-CM | POA: Insufficient documentation

## 2015-11-18 DIAGNOSIS — J45901 Unspecified asthma with (acute) exacerbation: Secondary | ICD-10-CM | POA: Diagnosis not present

## 2015-11-18 DIAGNOSIS — Z8701 Personal history of pneumonia (recurrent): Secondary | ICD-10-CM | POA: Diagnosis not present

## 2015-11-18 DIAGNOSIS — R000 Tachycardia, unspecified: Secondary | ICD-10-CM | POA: Insufficient documentation

## 2015-11-18 DIAGNOSIS — K219 Gastro-esophageal reflux disease without esophagitis: Secondary | ICD-10-CM | POA: Diagnosis not present

## 2015-11-18 DIAGNOSIS — Z8639 Personal history of other endocrine, nutritional and metabolic disease: Secondary | ICD-10-CM | POA: Insufficient documentation

## 2015-11-18 DIAGNOSIS — K209 Esophagitis, unspecified without bleeding: Secondary | ICD-10-CM

## 2015-11-18 LAB — CBC
HEMATOCRIT: 49.6 % (ref 39.0–52.0)
HEMOGLOBIN: 17.6 g/dL — AB (ref 13.0–17.0)
MCH: 28 pg (ref 26.0–34.0)
MCHC: 35.5 g/dL (ref 30.0–36.0)
MCV: 78.9 fL (ref 78.0–100.0)
Platelets: 212 10*3/uL (ref 150–400)
RBC: 6.29 MIL/uL — AB (ref 4.22–5.81)
RDW: 12.8 % (ref 11.5–15.5)
WBC: 8.8 10*3/uL (ref 4.0–10.5)

## 2015-11-18 LAB — D-DIMER, QUANTITATIVE (NOT AT ARMC): D DIMER QUANT: 0.49 ug{FEU}/mL (ref 0.00–0.50)

## 2015-11-18 LAB — I-STAT CHEM 8, ED
BUN: 22 mg/dL — AB (ref 6–20)
CHLORIDE: 101 mmol/L (ref 101–111)
Calcium, Ion: 1.07 mmol/L — ABNORMAL LOW (ref 1.12–1.23)
Creatinine, Ser: 1.1 mg/dL (ref 0.61–1.24)
Glucose, Bld: 112 mg/dL — ABNORMAL HIGH (ref 65–99)
HCT: 54 % — ABNORMAL HIGH (ref 39.0–52.0)
Hemoglobin: 18.4 g/dL — ABNORMAL HIGH (ref 13.0–17.0)
POTASSIUM: 3.4 mmol/L — AB (ref 3.5–5.1)
SODIUM: 137 mmol/L (ref 135–145)
TCO2: 22 mmol/L (ref 0–100)

## 2015-11-18 LAB — I-STAT TROPONIN, ED: Troponin i, poc: 0 ng/mL (ref 0.00–0.08)

## 2015-11-18 MED ORDER — POTASSIUM CHLORIDE 20 MEQ/15ML (10%) PO SOLN
40.0000 meq | Freq: Once | ORAL | Status: DC
  Filled 2015-11-18: qty 30

## 2015-11-18 MED ORDER — SODIUM CHLORIDE 0.9 % IV BOLUS (SEPSIS)
2000.0000 mL | Freq: Once | INTRAVENOUS | Status: AC
  Administered 2015-11-18: 2000 mL via INTRAVENOUS

## 2015-11-18 MED ORDER — POTASSIUM CHLORIDE CRYS ER 20 MEQ PO TBCR
40.0000 meq | EXTENDED_RELEASE_TABLET | Freq: Once | ORAL | Status: DC
  Filled 2015-11-18: qty 2

## 2015-11-18 NOTE — ED Provider Notes (Signed)
CSN: [id number]     Arrival date & time 11/18/15  2129 History   First MD Initiated Contact with Patient 11/18/15 2215     Chief Complaint  Patient presents with  . Extremity numbness     . Shortness of Breath     (Consider location/radiation/quality/duration/timing/severity/associated sxs/prior Treatment) HPI Patient developed shortness of breath gradual onset accompanied by numbness in all 4 extremities 8:40 PM today. Symptoms wax and wane. Nothing makes symptoms better or worse. No treatment prior to coming here. Patient reports she's had diminished oral intake lately due to esophagitis bothering him. He's recently been switched from Prilosec to Zantac which has improved his esophagitis. He denies any chest pain denies fever . He admits to feeling somewhat anxious .no other associated symptoms Past Medical History  Diagnosis Date  . Allergy   . GERD (gastroesophageal reflux disease)   . Hyperlipidemia   . Pneumonia   . Asthma     as child  . Eosinophilic esophagitis    Past Surgical History  Procedure Laterality Date  . Vasectomy    . Upper gastrointestinal endoscopy     Family History  Problem Relation Age of Onset  . Hyperlipidemia Mother   . Hypertension Mother   . Hyperlipidemia Father   . Hypertension Father   . Mental illness Father   . Esophageal cancer Neg Hx   . Rectal cancer Neg Hx   . Stomach cancer Neg Hx   . Colon cancer Neg Hx   . Colon polyps Cousin    Social History  Substance Use Topics  . Smoking status: Never Smoker   . Smokeless tobacco: Never Used  . Alcohol Use: Yes     Comment: very rarely    Review of Systems  Constitutional: Negative.   HENT: Negative.   Respiratory: Positive for shortness of breath.   Cardiovascular: Negative.   Gastrointestinal: Negative.   Musculoskeletal: Negative.   Skin: Negative.   Neurological: Positive for numbness.  Psychiatric/Behavioral: Negative.   All other systems reviewed and are  negative.     Allergies  Corn-containing products; Eggs or egg-derived products; and Barley grass  Home Medications   Prior to Admission medications   Medication Sig Start Date End Date Taking? Authorizing Provider  diphenhydrAMINE (SOMINEX) 25 MG tablet Take 25 mg by mouth at bedtime as needed for itching or sleep.   Yes Historical Provider, MD  fluticasone (FLOVENT HFA) 220 MCG/ACT inhaler Inhale 2 puffs into the lungs 2 (two) times daily. 11/11/15  Yes Napoleon FormKavitha Nandigam V, MD  ranitidine (ZANTAC) 75 MG tablet Take 75 mg by mouth 2 (two) times daily.   Yes Historical Provider, MD  dexlansoprazole (DEXILANT) 60 MG capsule Take 1 capsule (60 mg total) by mouth daily. Patient not taking: Reported on 11/18/2015 11/17/15   Napoleon FormKavitha Nandigam V, MD  lansoprazole (PREVACID) 30 MG capsule Take 1 capsule (30 mg total) by mouth 2 (two) times daily before a meal. Patient not taking: Reported on 11/18/2015 11/11/15   Napoleon FormKavitha Nandigam V, MD  LORazepam (ATIVAN) 1 MG tablet Take 1 tablet (1 mg total) by mouth every 8 (eight) hours as needed for anxiety. Patient not taking: Reported on 11/18/2015 11/13/15   Richardean Canalavid H Yao, MD   BP 108/53 mmHg  Pulse 105  Temp(Src) 98 F (36.7 C) (Oral)  Resp 14  SpO2 96% Physical Exam  Constitutional: He appears well-developed and well-nourished.  HENT:  Head: Normocephalic and atraumatic.  Eyes: Conjunctivae are normal. Pupils are equal, round, and  reactive to light.  Neck: Neck supple. No tracheal deviation present. No thyromegaly present.  Cardiovascular: Regular rhythm.   No murmur heard. Tachycardic  Pulmonary/Chest: Effort normal and breath sounds normal.  Abdominal: Soft. Bowel sounds are normal. He exhibits no distension. There is no tenderness.  Musculoskeletal: Normal range of motion. He exhibits no edema or tenderness.  Neurological: He is alert. Coordination normal.  Skin: Skin is warm and dry. No rash noted.  Psychiatric: He has a normal mood and  affect.  Nursing note and vitals reviewed.   ED Course  Procedures (including critical care time) Labs Review Labs Reviewed  D-DIMER, QUANTITATIVE (NOT AT [facility])  CBC  I-STAT CHEM 8, ED  I-STAT TROPOININ, ED    Imaging Review No results found. I have personally reviewed and evaluated these images and lab results as part of my medical decision-making.   EKG Interpretation   Date/Time:  Tuesday November 18 2015 21:36:00 EST Ventricular Rate:  99 PR Interval:  108 QRS Duration: 79 QT Interval:  324 QTC Calculation: 416 R Axis:   85 Text Interpretation:  Sinus tachycardia Atrial premature complex Probable  LVH with secondary repol abnrm SINCE LAST TRACING HEART RATE HAS INCREASED  Confirmed by Ethelda Chick  MD, Dshaun Reppucci [phone]) on 11/18/2015 9:44:37 PM      Pt signed out to Dr. Nicanor Alcon at 1215 am  MDM  Low pretest probability for PE , neg ddimer  Highly atypical story for acs , no chest pain pt given oral potassium here. Feel the patient has large component of anxiety contributing to symptoms and tachycardia Final diagnoses:  None   Dx #1 dyspnea #2 paresthesias  #3hypokalemia     Doug Sou, MD 11/19/15 0025

## 2015-11-18 NOTE — ED Notes (Signed)
Called into pts room, states he is feeling more chest tightness, shortness of breath and is numb on both sides of his body. Heart Rate is noted to be in the 120's but lowered back down to low 100's quickly after entering the room.

## 2015-11-18 NOTE — ED Notes (Addendum)
Pt presents with c/o numbness in his hands that started earlier today. Pt also c/o some shortness of breath today as well. No distress at this time, talking in complete sentences. Pt reports that he has not been able to eat very well for a week and a half and that today the water he was drinking tasted funny. Pt also reports that he feels like he is having palpitations.

## 2015-11-19 LAB — I-STAT TROPONIN, ED: TROPONIN I, POC: 0.01 ng/mL (ref 0.00–0.08)

## 2015-11-19 MED ORDER — POTASSIUM CHLORIDE CRYS ER 20 MEQ PO TBCR
40.0000 meq | EXTENDED_RELEASE_TABLET | Freq: Once | ORAL | Status: DC
  Filled 2015-11-19: qty 2

## 2015-11-19 NOTE — ED Notes (Signed)
Discharge instructions and follow up care reviewed with patient. Patient verbalized understanding. 

## 2015-11-20 ENCOUNTER — Ambulatory Visit (HOSPITAL_COMMUNITY): Payer: Managed Care, Other (non HMO)

## 2015-12-02 ENCOUNTER — Telehealth: Payer: Self-pay | Admitting: Gastroenterology

## 2015-12-02 NOTE — Telephone Encounter (Signed)
Mr Gabriel Greer has continued with Zantac for his reflux symptoms, taking 75 mg BID. Dr Tenny Crawoss has started him on Zoloft and is tapering him off of Ativan. He calls today asking if there is a connection between decreasing the Ativan and an increase in his symptoms (food sticking in his throat). Discussed that it is not likely the Ativan controls reflux symptoms. But he could contact his PCP to see if he should taper the Ativan differently. Offered he could consider increasing his OTC Zantac or resume the PPI he was prescribed. He will consider these options and call back if he has further questions.

## 2015-12-02 NOTE — Telephone Encounter (Signed)
He will need to start PPI. We [facility]

## 2015-12-04 ENCOUNTER — Telehealth: Payer: Self-pay | Admitting: *Deleted

## 2015-12-04 MED ORDER — FLUTICASONE PROPIONATE HFA 220 MCG/ACT IN AERO: 2.0000 | INHALATION_SPRAY | Freq: Two times a day (BID) | RESPIRATORY_TRACT | Status: DC

## 2015-12-04 NOTE — Telephone Encounter (Signed)
Fax request for inhaler faxed back to pharmacy

## 2015-12-05 ENCOUNTER — Telehealth: Payer: Self-pay | Admitting: Gastroenterology

## 2015-12-05 NOTE — Telephone Encounter (Signed)
Patient notified ok to take with each meal

## 2015-12-22 ENCOUNTER — Encounter: Payer: Managed Care, Other (non HMO) | Admitting: Gastroenterology

## 2016-01-20 ENCOUNTER — Ambulatory Visit: Payer: Managed Care, Other (non HMO) | Admitting: Gastroenterology

## 2016-10-07 ENCOUNTER — Other Ambulatory Visit: Payer: Self-pay | Admitting: Gastroenterology

## 2017-06-24 ENCOUNTER — Ambulatory Visit: Payer: Managed Care, Other (non HMO) | Admitting: Cardiology

## 2017-06-24 ENCOUNTER — Telehealth: Payer: Self-pay

## 2017-06-24 ENCOUNTER — Ambulatory Visit (INDEPENDENT_AMBULATORY_CARE_PROVIDER_SITE_OTHER): Payer: Commercial Managed Care - PPO | Admitting: Cardiology

## 2017-06-24 ENCOUNTER — Encounter: Payer: Self-pay | Admitting: Cardiology

## 2017-06-24 VITALS — BP 120/70 | HR 89 | Ht 73.0 in | Wt 175.0 lb

## 2017-06-24 DIAGNOSIS — R079 Chest pain, unspecified: Secondary | ICD-10-CM | POA: Insufficient documentation

## 2017-06-24 DIAGNOSIS — G4733 Obstructive sleep apnea (adult) (pediatric): Secondary | ICD-10-CM

## 2017-06-24 DIAGNOSIS — R0789 Other chest pain: Secondary | ICD-10-CM | POA: Insufficient documentation

## 2017-06-24 DIAGNOSIS — R0602 Shortness of breath: Secondary | ICD-10-CM | POA: Diagnosis not present

## 2017-06-24 NOTE — Telephone Encounter (Signed)
SENT NOTES TO SCHEDULING 

## 2017-06-24 NOTE — Patient Instructions (Addendum)
Medication Instructions:  Your physician recommends that you continue on your current medications as directed. Please refer to the Current Medication list given to you today.  Labwork: None   Testing/Procedures: Your physician has requested that you have an exercise tolerance test. For further information please visit [email or web address] Please also follow instruction sheet, as given.  Your physician has requested that you have an echocardiogram. Echocardiography is a painless test that uses sound waves to create images of your heart. It provides your doctor with information about the size and shape of your heart and how well your heart's chambers and valves are working. This procedure takes approximately one hour. There are no restrictions for this procedure.  Echocardiogram An echocardiogram, or echocardiography, uses sound waves (ultrasound) to produce an image of your heart. The echocardiogram is simple, painless, obtained within a short period of time, and offers valuable information to your health care provider. The images from an echocardiogram can provide information such as:  Evidence of coronary artery disease (CAD).  Heart size.  Heart muscle function.  Heart valve function.  Aneurysm detection.  Evidence of a past heart attack.  Fluid buildup around the heart.  Heart muscle thickening.  Assess heart valve function.  Tell a health care provider about:  Any allergies you have.  All medicines you are taking, including vitamins, herbs, eye drops, creams, and over-the-counter medicines.  Any problems you or family members have had with anesthetic medicines.  Any blood disorders you have.  Any surgeries you have had.  Any medical conditions you have.  Whether you are pregnant or may be pregnant. What happens before the procedure? No special preparation is needed. Eat and drink normally. What happens during the procedure?  In order to produce an image of your  heart, gel will be applied to your chest and a wand-like tool (transducer) will be moved over your chest. The gel will help transmit the sound waves from the transducer. The sound waves will harmlessly bounce off your heart to allow the heart images to be captured in real-time motion. These images will then be recorded.  You may need an IV to receive a medicine that improves the quality of the pictures. What happens after the procedure? You may return to your normal schedule including diet, activities, and medicines, unless your health care provider tells you otherwise. This information is not intended to replace advice given to you by your health care provider. Make sure you discuss any questions you have with your health care provider. Document Released: 11/19/2000 Document Revised: 07/10/2016 Document Reviewed: 07/30/2013 Elsevier Interactive Patient Education  2017 Elsevier Inc.   Follow-Up: Your physician recommends that you schedule a follow-up appointment in: 2 months with Dr. Tomie Chinaevankar.  Any Other Special Instructions Will Be Listed Below (If Applicable).     If you need a refill on your cardiac medications before your next appointment, please call your pharmacy.

## 2017-06-24 NOTE — Addendum Note (Signed)
Addended by: Rodney LangtonITTER, Kayna Suppa M on: 06/24/2017 09:37 AM   Modules accepted: Orders

## 2017-06-24 NOTE — Progress Notes (Signed)
Cardiology Office Note:    Date:  06/24/2017   ID:  Gabriel Greer, DOB 09/21/1966, MRN [id number]  PCP:  Daisy Florooss, Charles Alan, MD  Cardiologist:  Garwin Brothersajan R Eyva Califano, MD   Referring MD: Daisy Florooss, Charles Alan, MD    ASSESSMENT:    1. Obstructive sleep apnea   2. Chest pain, unspecified type   3. Shortness of breath    PLAN:    In order of problems listed above:  1. I discussed my findings with the patient at extensive length. His symptoms are not very concerning however in view of his risk factors of dyslipidemia and his concern he will undergo treadmill stress testing to assess the shortness of breath. He has had no palpitations or any such symptoms so asked him to monitor the symptoms and get back to me if he has any such issues we can always put a Holter monitoring. I reviewed lab work with him at extensive length. I think his blood pressure is close to normal and I have asked him to observe lifestyle modification modification. He is a very intelligent gentleman and he and his wife had multiple questions which were answered to their satisfaction. They will hold hydrochlorothiazide at this time. Patient will be seen in follow-up appointment in 2 months or earlier if he has any concerns. He knows to call us if he needs to be seen earlier. Echocardiogram will be done to assess murmur heard on auscultation. His lipids will be followed by primary care physician and diet was discussed. He knows to go to the nearest emergency room for any concerning symptoms.   Medication Adjustments/Labs and Tests Ordered: Current medicines are reviewed at length with the patient today.  Concerns regarding medicines are outlined above.  No orders of the defined types were placed in this encounter.  No orders of the defined types were placed in this encounter.    History of Present Illness:    Gabriel Greer is a 51 y.o. male who is being seen today for the evaluation of Shortness of breath at the request of Daisy Florooss,  Charles Alan, MD. Patient is a pleasant 51 year old male. He has past medical history of dyslipidemia. He mentions to me that he occasionally has some shortness of breath issues. This is not related to exertion. He walks about a mile and a half to 2 miles 3-4 times a week without any problems. No chest pain orthopnea or PND. He mentions that he saw his heart rate go from 80-100 on his [employer] watch and was concerned. NO chest pain orthopnea or PND. His blood pressure was elevated when he saw primary care physician and he was initiated on hydrochlorothiazide. He gives history suggestive of postural hypotension after he has become this medications.  Past Medical History:  Diagnosis Date  . Allergy   . Asthma    as child  . Eosinophilic esophagitis   . GERD (gastroesophageal reflux disease)   . Hyperlipidemia   . Pneumonia     Past Surgical History:  Procedure Laterality Date  . UPPER GASTROINTESTINAL ENDOSCOPY    . VASECTOMY      Current Medications: Current Meds  Medication Sig  . FLOVENT HFA 220 MCG/ACT inhaler INHALE 2 PUFFS BY MOUTH TWICE A DAY  . hydrochlorothiazide (MICROZIDE) 12.5 MG capsule Take 12.5 mg by mouth daily.  . mirtazapine (REMERON) 15 MG tablet Take 15 mg by mouth.  Marland Kitchen. omeprazole (PRILOSEC) 20 MG capsule Take 20 mg by mouth daily.  Allergies:   Corn-containing products; Eggs or egg-derived products; Corn oil; Peanut oil; Tree extract; Wheat bran; and Barley grass   Social History   Social History  . Marital status: Married    Spouse name: N/A  . Number of children: 2  . 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 Smoking status: Never Smoker  . Smokeless tobacco: Never Used  . Alcohol use Yes     Comment: very rarely  . Drug use: No  . Sexual activity: Not Asked   Other Topics Concern  . None   Social History Narrative  . None     Family History: The patient's family  history includes Colon polyps in his cousin; Hyperlipidemia in his father and mother; Hypertension in his father and mother; Mental illness in his father. There is no history of Esophageal cancer, Rectal cancer, Stomach cancer, or Colon cancer.  ROS:   Please see the history of present illness.    All other systems reviewed and are negative.  EKGs/Labs/Other Studies Reviewed:    The following studies were reviewed today: I reviewed the patient's records including labs sent from the primary care physician's office. I'm awaiting the EKG.   Recent Labs: No results found for requested labs within last 8760 hours.  Recent Lipid Panel No results found for: CHOL, TRIG, HDL, CHOLHDL, VLDL, LDLCALC, LDLDIRECT  Physical Exam:    VS:  BP 120/70   Pulse 89   Ht 6\' 1"  (1.854 m)   Wt 175 lb 0.6 oz (79.4 kg)   SpO2 98%   BMI 23.09 kg/m     Wt Readings from Last 3 Encounters:  06/24/17 175 lb 0.6 oz (79.4 kg)  11/11/15 184 lb (83.5 kg)  11/08/15 189 lb 6 oz (85.9 kg)     GEN: Patient is in no acute distress HEENT: Normal NECK: No JVD; No carotid bruits LYMPHATICS: No lymphadenopathy CARDIAC: S1 S2 regular, 2/6 systolic murmur at the apex. RESPIRATORY:  Clear to auscultation without rales, wheezing or rhonchi  ABDOMEN: Soft, non-tender, non-distended MUSCULOSKELETAL:  No edema; No deformity  SKIN: Warm and dry NEUROLOGIC:  Alert and oriented x 3 PSYCHIATRIC:  Normal affect    Signed, Garwin Brothers, MD  06/24/2017 9:29 AM    [facility]

## 2017-07-12 ENCOUNTER — Ambulatory Visit (INDEPENDENT_AMBULATORY_CARE_PROVIDER_SITE_OTHER): Payer: Commercial Managed Care - PPO

## 2017-07-12 ENCOUNTER — Other Ambulatory Visit: Payer: Self-pay

## 2017-07-12 ENCOUNTER — Ambulatory Visit (HOSPITAL_COMMUNITY): Payer: Commercial Managed Care - PPO | Attending: Cardiology

## 2017-07-12 DIAGNOSIS — R0602 Shortness of breath: Secondary | ICD-10-CM | POA: Insufficient documentation

## 2017-07-12 DIAGNOSIS — I340 Nonrheumatic mitral (valve) insufficiency: Secondary | ICD-10-CM | POA: Diagnosis not present

## 2017-07-13 LAB — EXERCISE TOLERANCE TEST
CHL CUP MPHR: 170 {beats}/min
CHL RATE OF PERCEIVED EXERTION: 18
CSEPEDS: 0 s
CSEPHR: 99 %
Estimated workload: 11.7 METS
Exercise duration (min): 10 min
Peak HR: 169 {beats}/min
Rest HR: 74 {beats}/min

## 2017-07-15 ENCOUNTER — Telehealth: Payer: Self-pay | Admitting: Cardiology

## 2017-07-15 NOTE — Telephone Encounter (Signed)
Patient is calling for his echo and stress test results.  °

## 2017-07-15 NOTE — Telephone Encounter (Signed)
Pt informed of normal results via voicemail by and [place]

## 2017-08-25 ENCOUNTER — Ambulatory Visit (INDEPENDENT_AMBULATORY_CARE_PROVIDER_SITE_OTHER): Payer: Commercial Managed Care - PPO | Admitting: Cardiology

## 2017-08-25 ENCOUNTER — Encounter: Payer: Self-pay | Admitting: Cardiology

## 2017-08-25 VITALS — BP 118/74 | HR 91 | Ht 73.0 in | Wt 176.8 lb

## 2017-08-25 DIAGNOSIS — R0602 Shortness of breath: Secondary | ICD-10-CM

## 2017-08-25 NOTE — Patient Instructions (Signed)
Medication Instructions:   Your physician recommends that you continue on your current medications as directed. Please refer to the Current Medication list given to you today.   Labwork:  NONE  Testing/Procedures:  NONE  Follow-Up:  Your physician recommends that you schedule a follow-up appointment in: AS NEEDED with Dr. Tomie China.    Any Other Special Instructions Will Be Listed Below (If Applicable).     If you need a refill on your cardiac medications before your next appointment, please call your pharmacy.

## 2017-08-25 NOTE — Progress Notes (Signed)
Cardiology Office Note:    Date:  08/25/2017   ID:  Gabriel Greer, DOB [birth year], MRN [id number]  PCP:  Daisy Floro, MD  Cardiologist:  Garwin Brothers, MD   Referring MD: Daisy Floro, MD    ASSESSMENT:    1. Shortness of breath    PLAN:    In order of problems listed above:  1. Primary prevention stressed to him. Importance of compliance with diet and medications stressed and he verbalized understanding. I reassured him and discussed findings of the stress test at length and he verbalized understanding. Questions were answered to his satisfaction. Exercise plan was outlined. Patient will be seen in follow-up on a when necessary basis only.   Medication Adjustments/Labs and Tests Ordered: Current medicines are reviewed at length with the patient today.  Concerns regarding medicines are outlined above.  No orders of the defined types were placed in this encounter.  No orders of the defined types were placed in this encounter.    Chief Complaint  Patient presents with  . Follow-up    DOING BETTER HAVEN'T HAD ANYMORE SYMPTOMS      History of Present Illness:    Gabriel Greer is a 51 y.o. male . He has no significant past medical history except problems with his esophagus. He had some history of shortness of breath and chest tightness and was concerned and therefore evaluated by me. He denies any chest pain orthopnea or PND. He underwent stress testing with excellent results and is here for follow-up.  Past Medical History:  Diagnosis Date  . Allergy   . Asthma    as child  . Eosinophilic esophagitis   . GERD (gastroesophageal reflux disease)   . Hyperlipidemia   . Pneumonia     Past Surgical History:  Procedure Laterality Date  . UPPER GASTROINTESTINAL ENDOSCOPY    . VASECTOMY      Current Medications: Current Meds  Medication Sig  . FLOVENT HFA 220 MCG/ACT inhaler INHALE 2 PUFFS BY MOUTH TWICE A DAY  . mirtazapine (REMERON) 15 MG tablet Take 15  mg by mouth.  Marland Kitchen omeprazole (PRILOSEC) 20 MG capsule Take 20 mg by mouth daily.     Allergies:   Corn-containing products; Eggs or egg-derived products; Corn oil; Peanut oil; Tree extract; Wheat bran; and Barley grass   Social History   Social History  . Marital status: Married    Spouse name: N/A  . Number of children: 2  . 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 Smoking status: Never Smoker  . Smokeless tobacco: Never Used  . Alcohol use Yes     Comment: very rarely  . Drug use: No  . Sexual activity: Not Asked   Other Topics Concern  . None   Social History Narrative  . None     Family History: The patient's family history includes Colon polyps in his cousin; Hyperlipidemia in his father and mother; Hypertension in his father and mother; Mental illness in his father. There is no history of Esophageal cancer, Rectal cancer, Stomach cancer, or Colon cancer.  ROS:   Please see the history of present illness.    All other systems reviewed and are negative.  EKGs/Labs/Other Studies Reviewed:    The following studies were reviewed today: I discussed the results of the stress test with the patient at extensive length.   Recent Labs: No results found  for requested labs within last 8760 hours.  Recent Lipid Panel No results found for: CHOL, TRIG, HDL, CHOLHDL, VLDL, LDLCALC, LDLDIRECT  Physical Exam:    VS:  BP 118/74   Pulse 91   Ht  (1.854 m)   Wt 176 lb 12.8 oz (80.2 kg)   SpO2 99%   BMI 23.33 kg/m     Wt Readings from Last 3 Encounters:  08/25/17 176 lb 12.8 oz (80.2 kg)  06/24/17 175 lb 0.6 oz (79.4 kg)  11/11/15 184 lb (83.5 kg)     GEN: Patient is in no acute distress HEENT: Normal NECK: No JVD; No carotid bruits LYMPHATICS: No lymphadenopathy CARDIAC: Hear sounds regular, 2/6 systolic murmur at the apex. RESPIRATORY:  Clear to auscultation without rales, wheezing or  rhonchi  ABDOMEN: Soft, non-tender, non-distended MUSCULOSKELETAL:  No edema; No deformity  SKIN: Warm and dry NEUROLOGIC:  Alert and oriented x 3 PSYCHIATRIC:  Normal affect   Signed, Garwin Brothers, MD  08/25/2017 11:15 AM    [facility]

## 2020-01-28 ENCOUNTER — Emergency Department (HOSPITAL_COMMUNITY): Payer: Commercial Managed Care - PPO

## 2020-01-28 ENCOUNTER — Encounter (HOSPITAL_COMMUNITY): Payer: Self-pay | Admitting: Emergency Medicine

## 2020-01-28 ENCOUNTER — Other Ambulatory Visit: Payer: Self-pay

## 2020-01-28 ENCOUNTER — Emergency Department (HOSPITAL_COMMUNITY)
Admission: EM | Admit: 2020-01-28 | Discharge: 2020-01-29 | Disposition: A | Discharge status: Home / Self Care | Payer: Commercial Managed Care - PPO | Attending: Emergency Medicine | Admitting: Emergency Medicine

## 2020-01-28 DIAGNOSIS — J45909 Unspecified asthma, uncomplicated: Secondary | ICD-10-CM | POA: Insufficient documentation

## 2020-01-28 DIAGNOSIS — R0602 Shortness of breath: Secondary | ICD-10-CM | POA: Diagnosis not present

## 2020-01-28 DIAGNOSIS — Z91010 Allergy to peanuts: Secondary | ICD-10-CM | POA: Insufficient documentation

## 2020-01-28 DIAGNOSIS — Z79899 Other long term (current) drug therapy: Secondary | ICD-10-CM | POA: Diagnosis not present

## 2020-01-28 NOTE — ED Triage Notes (Signed)
Pt reports episodes of shortness of breath that feel like asthma attacks to him that started in March 2020 and have become more persistent in the last few weeks. Seen by his allergist who prescribed advair, singulair and a tapering dose of prednisone last week. Reports no improvement. Denies chest pain. At this time pt's respirations equal and unlabored, SpO2 98% room air.

## 2020-01-29 ENCOUNTER — Emergency Department (HOSPITAL_COMMUNITY): Payer: Commercial Managed Care - PPO

## 2020-01-29 LAB — COMPREHENSIVE METABOLIC PANEL
ALT: 34 U/L (ref 0–44)
AST: 33 U/L (ref 15–41)
Albumin: 3.2 g/dL — ABNORMAL LOW (ref 3.5–5.0)
Alkaline Phosphatase: 45 U/L (ref 38–126)
Anion gap: 11 (ref 5–15)
BUN: 22 mg/dL — ABNORMAL HIGH (ref 6–20)
CO2: 25 mmol/L (ref 22–32)
Calcium: 8.8 mg/dL — ABNORMAL LOW (ref 8.9–10.3)
Chloride: 103 mmol/L (ref 98–111)
Creatinine, Ser: 1.07 mg/dL (ref 0.61–1.24)
GFR calc Af Amer: >60 mL/min (ref >60)
GFR calc non Af Amer: >60 mL/min (ref >60)
Glucose, Bld: 105 mg/dL — ABNORMAL HIGH (ref 70–99)
Potassium: 4 mmol/L (ref 3.5–5.1)
Sodium: 139 mmol/L (ref 135–145)
Total Bilirubin: 0.8 mg/dL (ref 0.3–1.2)
Total Protein: 6.3 g/dL — ABNORMAL LOW (ref 6.5–8.1)

## 2020-01-29 LAB — CBC WITH DIFFERENTIAL/PLATELET
Abs Immature Granulocytes: 0.03 10*3/uL (ref 0.00–0.07)
Basophils Absolute: 0 10*3/uL (ref 0.0–0.1)
Basophils Relative: 0 %
Eosinophils Absolute: 0 10*3/uL (ref 0.0–0.5)
Eosinophils Relative: 0 %
HCT: 45.5 % (ref 39.0–52.0)
Hemoglobin: 14.9 g/dL (ref 13.0–17.0)
Immature Granulocytes: 0 %
Lymphocytes Relative: 14 %
Lymphs Abs: 1.3 10*3/uL (ref 0.7–4.0)
MCH: 28 pg (ref 26.0–34.0)
MCHC: 32.7 g/dL (ref 30.0–36.0)
MCV: 85.4 fL (ref 80.0–100.0)
Monocytes Absolute: 0.6 10*3/uL (ref 0.1–1.0)
Monocytes Relative: 6 %
Neutro Abs: 7.4 10*3/uL (ref 1.7–7.7)
Neutrophils Relative %: 80 %
Platelets: 196 10*3/uL (ref 150–400)
RBC: 5.33 MIL/uL (ref 4.22–5.81)
RDW: 13.1 % (ref 11.5–15.5)
WBC: 9.4 10*3/uL (ref 4.0–10.5)
nRBC: 0 % (ref 0.0–0.2)

## 2020-01-29 LAB — TROPONIN I (HIGH SENSITIVITY): Troponin I (High Sensitivity): 7 ng/L (ref <18)

## 2020-01-29 MED ORDER — IOHEXOL 350 MG/ML SOLN
100.0000 mL | Freq: Once | INTRAVENOUS | Status: AC | PRN
  Administered 2020-01-29: 100 mL via INTRAVENOUS

## 2020-01-29 NOTE — ED Provider Notes (Signed)
[facility] EMERGENCY DEPARTMENT Provider Note   CSN: [id number] Arrival date & time: 01/28/20  2017     History Chief Complaint  Patient presents with  . Shortness of Breath    Gabriel Greer is a 54 y.o. male.   54 year old male with history of asthma, eosinophilic esophagitis, dyslipidemia presents to the emergency department for evaluation of shortness of breath.  Patient began having episodes of shortness of breath in March 2020.  Over the past few weeks he feels that these episodes have become more frequent.  Describes his shortness of breath sensation as inability to "get all the air out".  He experiences these episodes while at rest and denies any specific aggravation with exertion.  No chest tightness, wheezing, chest pain.  Further denies fevers, hemoptysis, leg swelling, nausea, vomiting, syncope.  Was recently seen by [facility] pulmonology/allergy who prescribed Advair, Singulair, prednisone taper.  He has been using these medications for at least the last 3 days without significant symptom improvement.  Continues to utilize an albuterol inhaler as needed.  Subjectively feels that his symptoms are aggravated by allergens, but reportedly had a thorough work-up by his allergist which was not notable for anything significant.  Denies ever being diagnosed with anxiety.  Does report poor sleeping habits lately as he sometimes will wake in the night due to an episode of SOB.  The history is provided by the patient. No language interpreter was used.  Shortness of Breath      Past Medical History:  Diagnosis Date  . Allergy   . Asthma    as child  . Eosinophilic esophagitis   . GERD (gastroesophageal reflux disease)   . Hyperlipidemia   . Pneumonia     Patient Active Problem List   Diagnosis Date Noted  . Shortness of breath 06/24/2017  . Eosinophilic esophagitis 05/08/2014  . Dysphagia, unspecified(787.20) 03/28/2014  . Obstructive sleep apnea 03/28/2014  .  KNEE PAIN, RIGHT 11/06/2009  . ILIOTIBIAL BAND SYNDROME, RIGHT KNEE 11/06/2009  . UNEQUAL LEG LENGTH 11/06/2009    Past Surgical History:  Procedure Laterality Date  . UPPER GASTROINTESTINAL ENDOSCOPY    . VASECTOMY         Family History  Problem Relation Age of Onset  . Hyperlipidemia Mother   . Hypertension Mother   . Hyperlipidemia Father   . Hypertension Father   . Mental illness Father   . Colon polyps Cousin   . Esophageal cancer Neg Hx   . Rectal cancer Neg Hx   . Stomach cancer Neg Hx   . Colon cancer Neg Hx     Social History   Tobacco Use  . Smoking status: Never Smoker  . Smokeless tobacco: Never Used  Substance Use Topics  . Alcohol use: Yes    Comment: very rarely  . Drug use: No    Home Medications Prior to Admission medications   Medication Sig Start Date End Date Taking? Authorizing Provider  ADVAIR DISKUS 250-50 MCG/DOSE AEPB Inhale 1 puff into the lungs in the morning and at bedtime. 01/23/20  Yes [provider]  mirtazapine (REMERON) 7.5 MG tablet Take 7.5 mg by mouth at bedtime. 01/23/20  Yes [provider]  montelukast (SINGULAIR) 10 MG tablet Take 10 mg by mouth at bedtime. 01/23/20  Yes [provider]  omeprazole (PRILOSEC) 10 MG capsule Take 10 mg by mouth daily. 11/28/19  Yes [provider]  predniSONE (DELTASONE) 5 MG tablet Take 35 mg by mouth daily.  01/25/20  Yes [provider]  Goodwin HFA 220 MCG/ACT inhaler INHALE 2 PUFFS BY MOUTH TWICE A DAY Patient not taking: Reported on 01/29/2020 10/07/16   Mauri Pole, MD    Allergies    Corn-containing products, Eggs or egg-derived products, Corn oil, Peanut oil, Tree extract, Wheat bran, and Barley grass   Review of Systems   Review of Systems  Respiratory: Positive for shortness of breath.   Ten systems reviewed and are negative for acute change, except as noted in the HPI.    Physical Exam Updated Vital Signs BP (!) 122/91    Pulse 87   Temp 98.2 F (36.8 C) (Oral)   Resp 13   Ht 6\' 1"  (1.854 m)   Wt 72.6 kg   SpO2 97%   BMI 21.11 kg/m   Physical Exam Vitals and nursing note reviewed.  Constitutional:      General: He is not in acute distress.    Appearance: He is well-developed. He is not diaphoretic.     Comments: Nontoxic appearing and in NAD  HENT:     Head: Normocephalic and atraumatic.  Eyes:     General: No scleral icterus.    Conjunctiva/sclera: Conjunctivae normal.  Cardiovascular:     Rate and Rhythm: Regular rhythm. Tachycardia present.     Pulses: Normal pulses.     Comments: Tachycardia is intermittent, up to 110bpm; waxing and waning. Pulmonary:     Effort: Pulmonary effort is normal. No respiratory distress.     Breath sounds: No stridor. No wheezing or rales.     Comments: Lungs CTAB. Respirations even and unlabored. Sats 97% or above with ambulation in room. Musculoskeletal:        General: Normal range of motion.     Cervical back: Normal range of motion.     Comments: No BLE edema.  Skin:    General: Skin is warm and dry.     Coloration: Skin is not pale.     Findings: No erythema or rash.  Neurological:     Mental Status: He is alert and oriented to person, place, and time.     Coordination: Coordination normal.  Psychiatric:        Behavior: Behavior normal.     ED Results / Procedures / Treatments   Labs (all labs ordered are listed, but only abnormal results are displayed) Labs Reviewed  COMPREHENSIVE METABOLIC PANEL - Abnormal; Notable for the following components:      Result Value   Glucose, Bld 105 (*)    BUN 22 (*)    Calcium 8.8 (*)    Total Protein 6.3 (*)    Albumin 3.2 (*)    All other components within normal limits  CBC WITH DIFFERENTIAL/PLATELET  TROPONIN I (HIGH SENSITIVITY)    EKG EKG Interpretation  Date/Time:  Monday January 28 2020 20:19:35 EST Ventricular Rate:  130 PR Interval:  128 QRS Duration: 74 QT Interval:  288 QTC  Calculation: 423 R Axis:   84 Text Interpretation: Sinus tachycardia Minimal voltage criteria for LVH, may be normal variant ST & T wave abnormality, consider inferior ischemia Abnormal ECG No significant change since 11/17/2020 Confirmed by Veryl Speak [phone]) on 01/29/2020 2:30:36 AM   Radiology DG Chest 2 View  Result Date: 01/28/2020 CLINICAL DATA:  Shortness of breath EXAM: CHEST - 2 VIEW COMPARISON:  11/11/2015 FINDINGS: Pulmonary hyperinflation. No focal opacity or pleural effusion. Normal heart size. No pneumothorax. IMPRESSION: No active cardiopulmonary disease. Electronically Signed  By: Jasmine Pang M.D.   On: 01/28/2020 21:16   CT Angio Chest PE W and/or Wo Contrast  Result Date: 01/29/2020 CLINICAL DATA:  Short of breath, asthma EXAM: CT ANGIOGRAPHY CHEST WITH CONTRAST TECHNIQUE: Multidetector CT imaging of the chest was performed using the standard protocol during bolus administration of intravenous contrast. Multiplanar CT image reconstructions and MIPs were obtained to evaluate the vascular anatomy. CONTRAST:  OMNIPAQUE IOHEXOL 350 MG/ML SOLN COMPARISON:  01/28/2020 FINDINGS: Cardiovascular: This is a technically adequate evaluation of the pulmonary vasculature. There are no filling defects or pulmonary emboli. Heart is unremarkable without pericardial effusion. Thoracic aorta is normal in caliber without aneurysm or dissection. Mediastinum/Nodes: No enlarged mediastinal, hilar, or axillary lymph nodes. Thyroid gland, trachea, and esophagus demonstrate no significant findings. Lungs/Pleura: No airspace disease, effusion, or pneumothorax. Central airways are patent. Upper Abdomen: Multiple liver hypodensities compatible with cysts. Otherwise the upper abdomen is unremarkable. Musculoskeletal: No acute or destructive bony lesions. Reconstructed images demonstrate no additional findings. Review of the MIP images confirms the above findings. IMPRESSION: 1. No CT evidence of pulmonary  emboli. 2. No acute cardiopulmonary process. Electronically Signed   By: Sharlet Salina M.D.   On: 01/29/2020 02:58    Procedures Procedures (including critical care time)  Medications Ordered in ED Medications  iohexol (OMNIPAQUE) 350 MG/ML injection 100 mL (100 mLs Intravenous Contrast Given 01/29/20 0246)    ED Course  I have reviewed the triage vital signs and the nursing notes.  Pertinent labs & imaging results that were available during my care of the patient were reviewed by me and considered in my medical decision making (see chart for details).    MDM Rules/Calculators/A&P                      54 year old male presents to the emergency department for further evaluation of shortness of breath.  His symptoms are fairly chronic, though he reports increased frequency of episodes over the past few weeks.  Has been utilizing an albuterol inhaler as well as Advair, Singulair, prednisone taper.  Does not feel that these medications are specifically alleviating his symptoms or frequency of his SOB episodes.  Shortness of breath is not brought on by exertion.  No recent infectious symptoms or fever.    Patient was noted to be tachycardic on arrival to the emergency department.  Pulmonary embolus was considered on the differential.  Chart reviewed and he did have a negative D-dimer 4 days ago completed at [facility].  I had further discussion with the patient regarding radiation risks associated with CT imaging.  Decision was made to proceed with CTA for further work-up despite negative dimer.  This shows no pneumothorax, infectious etiology, pleural effusion, pericardial effusion, pulmonary embolus.  Symptoms very atypical for ACS.  The patient has no associated chest pain with his SOB.  EKG with nonspecific ST and T wave changes, though he has a negative troponin.  Do not feel these EKG changes represent acute ischemia.  Patient scheduled for follow-up with his cardiologist on Wednesday.  I feel it  is reasonable for him to keep this appointment for further outpatient work-up.  Upon further discussion with the patient, there is underlying suspicion that his shortness of breath may be due to untreated anxiety.  He states that he has been doubling up on N95 mask usage, but continues to feel there are environmental allergens exacerbating his symptoms.  Initially felt that these allergens were present at work as  he would only experience symptoms there; however, he is now experiencing symptoms while in his home.  He has limited himself to his bedroom only as he is concerned that movement throughout his home may cause further exposure to possible allergens.  Has also been avoiding use of his car as he had shortness of breath episode while in his vehicle.  Intermittent tachycardia does lend itself to increased anxiety state.  This would end up being a diagnosis of exclusion.  Also on the patient's differential is esophageal spasm given known issues with EOE/GERD.  Low suspicion for emergent cause of chronic complaint.  Patient stable for discharge today.  Has been encouraged to continue follow-up with his primary care doctor as well as his specialists.  Return precautions discussed and provided. Patient discharged in stable condition with no unaddressed concerns.   Final Clinical Impression(s) / ED Diagnoses Final diagnoses:  SOB (shortness of breath)    Rx / DC Orders ED Discharge Orders    None       Antony Madura, PA-C 01/29/20 0417    Geoffery Lyons, MD 01/29/20 [id number]

## 2020-01-29 NOTE — ED Notes (Signed)
Pt ambulated inside the room. Pt walked with steady gait and stable on feet. O2 98% on RA. Pt denies headache or dizziness.

## 2020-01-29 NOTE — Discharge Instructions (Addendum)
Your work-up in the emergency department today was reassuring and did not reveal a concerning cause of your symptoms.  We recommend that you continue your prescribed medications.  Follow-up with your primary care doctor as well as with cardiology, as scheduled, and pulmonology.  You may return to the ED for any new or concerning symptoms.

## 2020-01-29 NOTE — ED Notes (Signed)
Asked pt if he had spoken to wife re: CT scan. States he has but has still not made up his mind. When asked what the barrier to his decision was, pt states that it's the time.

## 2020-01-30 ENCOUNTER — Other Ambulatory Visit: Payer: Self-pay

## 2020-01-30 ENCOUNTER — Ambulatory Visit: Payer: Commercial Managed Care - PPO | Admitting: Cardiology

## 2020-01-30 ENCOUNTER — Encounter: Payer: Self-pay | Admitting: Cardiology

## 2020-01-30 VITALS — BP 122/80 | HR 98 | Ht 73.0 in | Wt 155.0 lb

## 2020-01-30 DIAGNOSIS — R0602 Shortness of breath: Secondary | ICD-10-CM

## 2020-01-30 DIAGNOSIS — K200 Eosinophilic esophagitis: Secondary | ICD-10-CM

## 2020-01-30 DIAGNOSIS — R0789 Other chest pain: Secondary | ICD-10-CM | POA: Diagnosis not present

## 2020-01-30 NOTE — Progress Notes (Signed)
Cardiology Office Note:    Date:  01/30/2020   ID:  Gabriel Greer, DOB 1966/05/11, MRN [id number]  PCP:  Daisy Floro, MD  Cardiologist:  Gypsy Balsam, MD    Referring MD: Daisy Floro, MD   No chief complaint on file. I have shortness of breath chest pain  History of Present Illness:    Gabriel Greer is a 54 y.o. male with past medical history significant for asthma, eosynophylic esophagitis, hyperlipidemia.  He was seen by [place] about 2 years ago evaluation at the time included echocardiogram which was normal as well as stress test which was also normal.  He was referred back to [place] because of exertional shortness of breath as well as some chest tightness.  He said he is trying to walk on the regular basis and doing quite well with that.  But that within the last 24 hours he started having some issues.  He ended going to the emergency room, biochemical markers were negative for myocardial injury, D-dimer was negative, CT angio of his chest was done which showed no evidence of PE.  Overall today he is doing well.  Yesterday he was also able to walk with his wife a little less for than usually shortness of breath.  Denies having any dizziness or passing out.  Past Medical History:  Diagnosis Date  . Allergy   . Asthma    as child  . Eosinophilic esophagitis   . GERD (gastroesophageal reflux disease)   . Hyperlipidemia   . Pneumonia     Past Surgical History:  Procedure Laterality Date  . UPPER GASTROINTESTINAL ENDOSCOPY    . VASECTOMY      Current Medications: Current Meds  Medication Sig  . ADVAIR DISKUS 250-50 MCG/DOSE AEPB Inhale 1 puff into the lungs in the morning and at bedtime.  . mirtazapine (REMERON) 7.5 MG tablet Take 7.5 mg by mouth at bedtime.  . montelukast (SINGULAIR) 10 MG tablet Take 10 mg by mouth at bedtime.  Marland Kitchen omeprazole (PRILOSEC) 10 MG capsule Take 10 mg by mouth daily.  . predniSONE (DELTASONE) 5 MG tablet Take 35 mg by mouth daily.      Allergies:   Corn-containing products, Eggs or egg-derived products, Corn oil, Peanut oil, Tree extract, Wheat bran, and Barley grass   Social History   Socioeconomic History  . Marital status: Married    Spouse name: Not on file  . Number of children: 2  . Years of education: Not on file  . Highest education level: Not on file  Occupational History  . 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 Smoking status: Never Smoker  . Smokeless tobacco: Never Used  Substance and Sexual Activity  . Alcohol use: Yes    Comment: very rarely  . Drug use: No  . Sexual activity: Not on file  Other Topics Concern  . Not on file  Social History Narrative  . Not on file   Social Determinants of Health   Financial Resource Strain:   . Difficulty of Paying Living Expenses: Not on file  Food Insecurity:   . Worried About Programme researcher, broadcasting/film/video in the Last Year: Not on file  . Ran Out of Food in the Last Year: Not on file  Transportation Needs:   . Lack of Transportation (Medical): Not on file  . Lack of Transportation (Non-Medical): Not on file  Physical Activity:   . Days of Exercise per Week:  Not on file  . Minutes of Exercise per Session: Not on file  Stress:   . Feeling of Stress : Not on file  Social Connections:   . Frequency of Communication with Friends and Family: Not on file  . Frequency of Social Gatherings with Friends and Family: Not on file  . Attends Religious Services: Not on file  . Active Member of Clubs or Organizations: Not on file  . Attends Archivist Meetings: Not on file  . Marital Status: Not on file     Family History: The patient's family history includes Colon polyps in his cousin; Hyperlipidemia in his father and mother; Hypertension in his father and mother; Mental illness in his father. There is no history of Esophageal cancer, Rectal cancer, Stomach cancer, or Colon cancer. ROS:   Please see the history of  present illness.    All 14 point review of systems negative except as described per history of present illness  EKGs/Labs/Other Studies Reviewed:      Recent Labs: 01/29/2020: ALT 34; BUN 22; Creatinine, Ser 1.07; Hemoglobin 14.9; Platelets 196; Potassium 4.0; Sodium 139  Recent Lipid Panel No results found for: CHOL, TRIG, HDL, CHOLHDL, VLDL, LDLCALC, LDLDIRECT  Physical Exam:    VS:  BP 122/80   Pulse 98   Ht 6\' 1"  (1.854 m)   Wt 155 lb (70.3 kg)   SpO2 96%   BMI 20.45 kg/m     Wt Readings from Last 3 Encounters:  01/30/20 155 lb (70.3 kg)  01/29/20 160 lb (72.6 kg)  08/25/17 176 lb 12.8 oz (80.2 kg)     GEN:  Well nourished, well developed in no acute distress HEENT: Normal NECK: No JVD; No carotid bruits LYMPHATICS: No lymphadenopathy CARDIAC: RRR, no murmurs, no rubs, no gallops RESPIRATORY:  Clear to auscultation without rales, wheezing or rhonchi  ABDOMEN: Soft, non-tender, non-distended MUSCULOSKELETAL:  No edema; No deformity  SKIN: Warm and dry LOWER EXTREMITIES: no swelling NEUROLOGIC:  Alert and oriented x 3 PSYCHIATRIC:  Normal affect   ASSESSMENT:    1. Atypical chest pain   2. Shortness of breath   3. Eosinophilic esophagitis    PLAN:    In order of problems listed above:  1. Very complicated situation gentleman with multiple issues including allergies as well as asthma presented back to [place] with atypical chest pain as well as exertional shortness of breath.  Of course it could be related to his lungs but we need to check his heart.  I will schedule him again to have an echocardiogram as well as a stress echocardiogram.  We debated about modality of stressing him.  Plan EKG treadmill stress test will not be sufficient since he does have some EKG changes which are chronic, also I prefer to avoid Lexiscan, CT angio of his coronaries will be an option however anticipate he may require multiple CAT scan in his life and try to avoid any  radiation. 2. Shortness of breath multifactorial plan as outlined above. 3. eosinophilic esophagitis follow-up by [facility].  I did review record from the emergency room yesterday  Medication Adjustments/Labs and Tests Ordered: Current medicines are reviewed at length with the patient today.  Concerns regarding medicines are outlined above.  No orders of the defined types were placed in this encounter.  Medication changes: No orders of the defined types were placed in this encounter.   Signed, Park Liter, MD, [facility] 01/30/2020 8:57 AM    [place]

## 2020-01-30 NOTE — Patient Instructions (Signed)
Medication Instructions:  Your physician recommends that you continue on your current medications as directed. Please refer to the Current Medication list given to you today.  *If you need a refill on your cardiac medications before your next appointment, please call your pharmacy*  Lab Work: Your physician recommends that you return for lab work today: lipids   If you have labs (blood work) drawn today and your tests are completely normal, you will receive your results only by: Marland Kitchen MyChart Message (if you have MyChart) OR . A paper copy in the mail If you have any lab test that is abnormal or we need to change your treatment, we will call you to review the results.  Testing/Procedures: Your physician has requested that you have a stress echocardiogram. For further information please visit [email or web address]. Please follow instruction sheet as given.  Your physician has requested that you have an echocardiogram. Echocardiography is a painless test that uses sound waves to create images of your heart. It provides your doctor with information about the size and shape of your heart and how well your heart's chambers and valves are working. This procedure takes approximately one hour. There are no restrictions for this procedure.     Follow-Up: At [facility], you and your health needs are our priority.  As part of our continuing mission to provide you with exceptional heart care, we have created designated Provider Care Teams.  These Care Teams include your primary Cardiologist (physician) and Advanced Practice Providers (APPs -  Physician Assistants and Nurse Practitioners) who all work together to provide you with the care you need, when you need it.  Your next appointment:   6 week(s)  The format for your next appointment:   In Person  Provider:   Jenne Campus, MD  Other Instructions   Exercise Stress Echocardiogram  An exercise stress echocardiogram is a test to check how  well your heart is working. This test uses sound waves (ultrasound) and a computer to make images of your heart before and after exercise. Ultrasound images that are taken before you exercise (your resting echocardiogram) will show how much blood is getting to your heart muscle and how well your heart muscle and heart valves are functioning. During the next part of this test, you will walk on a treadmill or ride a stationary bike to see how exercise affects your heart. While you exercise, the electrical activity of your heart will be monitored with an electrocardiogram (ECG). Your blood pressure will also be monitored. You may have this test if you:  Have chest pain or other symptoms of a heart problem.  Recently had a heart attack or heart surgery.  Have heart valve problems.  Have a condition that causes narrowing of the blood vessels that supply your heart (coronary artery disease).  Have a high risk of heart disease and are starting a new exercise program.  Have a high risk of heart disease and need to have major surgery. Tell a health care provider about:  Any allergies you have.  All medicines you are taking, including vitamins, herbs, eye drops, creams, and over-the-counter medicines.  Any problems you or family members have had with anesthetic medicines.  Any blood disorders you have.  Any surgeries you have had.  Any medical conditions you have.  Whether you are pregnant or may be pregnant. What are the risks? Generally, this is a safe procedure. However, problems may occur, including:  Chest pain.  Dizziness or light-headedness.  Shortness of  breath.  Increased or irregular heartbeat (palpitations).  Nausea or vomiting.  Heart attack (very rare). What happens before the procedure?  Follow instructions from your health care provider about eating or drinking restrictions. You may be asked to avoid all forms of caffeine for 24 hours before your procedure, or as  told by your health care provider.  Ask your health care provider about changing or stopping your regular medicines. This is especially important if you are taking diabetes medicines or blood thinners.  If you use an inhaler, bring it with you to the test.  Wear loose, comfortable clothing and walking shoes.  Do notuse any products that contain nicotine or tobacco, such as cigarettes and e-cigarettes, for 4 hours before the test or as told by your health care provider. If you need help quitting, ask your health care provider. What happens during the procedure?  You will take off your clothes from the waist up and put on a hospital gown.  A technician will place electrodes on your chest.  A blood pressure cuff will be placed on your arm.  You will lie down on a table for an ultrasound exam before you exercise. Gel will be rubbed on your chest, and a handheld device (transducer) will be pressed against your chest and moved over your heart.  Then, you will start exercising by walking on a treadmill or pedaling a stationary bicycle.  Your blood pressure and heart rhythm will be monitored while you exercise.  The exercise will gradually get harder or faster.  You will exercise until: ? Your heart reaches a target level. ? You are too tired to continue. ? You cannot continue because of chest pain, weakness, or dizziness.  You will have another ultrasound exam after you stop exercising. The procedure may vary among health care providers and hospitals. What happens after the procedure?  Your heart rate and blood pressure will be monitored until they return to your normal levels. Summary  An exercise stress echocardiogram is a test that uses ultrasound to check how well your heart works before and after exercise.  Before the test, follow instructions from your health care provider about stopping medications, avoiding nicotine and tobacco, and avoiding certain foods and drinks.   During the test, your blood pressure and heart rhythm will be monitored while you exercise on a treadmill or stationary bicycle. This information is not intended to replace advice given to you by your health care provider. Make sure you discuss any questions you have with your health care provider. Document Revised: 08/06/2016 Document Reviewed: 07/14/2016 Elsevier Patient Education  2020 [employer].   Echocardiogram An echocardiogram is a procedure that uses painless sound waves (ultrasound) to produce an image of the heart. Images from an echocardiogram can provide important information about:  Signs of coronary artery disease (CAD).  Aneurysm detection. An aneurysm is a weak or damaged part of an artery wall that bulges out from the normal force of blood pumping through the body.  Heart size and shape. Changes in the size or shape of the heart can be associated with certain conditions, including heart failure, aneurysm, and CAD.  Heart muscle function.  Heart valve function.  Signs of a past heart attack.  Fluid buildup around the heart.  Thickening of the heart muscle.  A tumor or infectious growth around the heart valves. Tell a health care provider about:  Any allergies you have.  All medicines you are taking, including vitamins, herbs, eye drops, creams, and  over-the-counter medicines.  Any blood disorders you have.  Any surgeries you have had.  Any medical conditions you have.  Whether you are pregnant or may be pregnant. What are the risks? Generally, this is a safe procedure. However, problems may occur, including:  Allergic reaction to dye (contrast) that may be used during the procedure. What happens before the procedure? No specific preparation is needed. You may eat and drink normally. What happens during the procedure?   An IV tube may be inserted into one of your veins.  You may receive contrast through this tube. A contrast is an injection that  improves the quality of the pictures from your heart.  A gel will be applied to your chest.  A wand-like tool (transducer) will be moved over your chest. The gel will help to transmit the sound waves from the transducer.  The sound waves will harmlessly bounce off of your heart to allow the heart images to be captured in real-time motion. The images will be recorded on a computer. The procedure may vary among health care providers and hospitals. What happens after the procedure?  You may return to your normal, everyday life, including diet, activities, and medicines, unless your health care provider tells you not to do that. Summary  An echocardiogram is a procedure that uses painless sound waves (ultrasound) to produce an image of the heart.  Images from an echocardiogram can provide important information about the size and shape of your heart, heart muscle function, heart valve function, and fluid buildup around your heart.  You do not need to do anything to prepare before this procedure. You may eat and drink normally.  After the echocardiogram is completed, you may return to your normal, everyday life, unless your health care provider tells you not to do that. This information is not intended to replace advice given to you by your health care provider. Make sure you discuss any questions you have with your health care provider. Document Revised: 03/15/2019 Document Reviewed: 12/25/2016 Elsevier Patient Education  2020 [employer].

## 2020-01-31 LAB — LIPID PANEL
Chol/HDL Ratio: 3.7 ratio (ref 0.0–5.0)
Cholesterol, Total: 218 mg/dL — ABNORMAL HIGH (ref 100–199)
HDL: 59 mg/dL (ref >39)
LDL Chol Calc (NIH): 145 mg/dL — ABNORMAL HIGH (ref 0–99)
Triglycerides: 81 mg/dL (ref 0–149)
VLDL Cholesterol Cal: 14 mg/dL (ref 5–40)

## 2020-02-11 ENCOUNTER — Ambulatory Visit (HOSPITAL_BASED_OUTPATIENT_CLINIC_OR_DEPARTMENT_OTHER)
Admission: RE | Admit: 2020-02-11 | Discharge: 2020-02-11 | Disposition: A | Discharge status: Home / Self Care | Payer: Commercial Managed Care - PPO | Source: Ambulatory Visit | Attending: Cardiology | Admitting: Cardiology

## 2020-02-11 DIAGNOSIS — R0602 Shortness of breath: Secondary | ICD-10-CM | POA: Insufficient documentation

## 2020-02-11 DIAGNOSIS — R0789 Other chest pain: Secondary | ICD-10-CM | POA: Diagnosis not present

## 2020-02-11 NOTE — Progress Notes (Signed)
  Echocardiogram 2D Echocardiogram has been performed.  Gabriel Greer 02/11/2020, 11:52 AM

## 2020-02-13 ENCOUNTER — Telehealth (HOSPITAL_COMMUNITY): Payer: Self-pay

## 2020-02-13 NOTE — Telephone Encounter (Signed)
Detailed instructions left on the patient's answering machine, Asked to call back with any questions. S.Rhiannan Kievit EMTP

## 2020-02-15 ENCOUNTER — Other Ambulatory Visit (HOSPITAL_COMMUNITY)
Admission: RE | Admit: 2020-02-15 | Discharge: 2020-02-15 | Disposition: A | Discharge status: Home / Self Care | Payer: Commercial Managed Care - PPO | Source: Ambulatory Visit | Attending: Cardiology | Admitting: Cardiology

## 2020-02-15 DIAGNOSIS — Z20822 Contact with and (suspected) exposure to covid-19: Secondary | ICD-10-CM | POA: Diagnosis not present

## 2020-02-15 DIAGNOSIS — Z01812 Encounter for preprocedural laboratory examination: Secondary | ICD-10-CM | POA: Insufficient documentation

## 2020-02-16 LAB — SARS CORONAVIRUS 2 (TAT 6-24 HRS): SARS Coronavirus 2: NEGATIVE

## 2020-02-18 ENCOUNTER — Other Ambulatory Visit: Payer: Self-pay | Admitting: Family Medicine

## 2020-02-18 DIAGNOSIS — R634 Abnormal weight loss: Secondary | ICD-10-CM

## 2020-02-19 ENCOUNTER — Other Ambulatory Visit (HOSPITAL_COMMUNITY): Payer: Commercial Managed Care - PPO

## 2020-02-21 ENCOUNTER — Ambulatory Visit
Admission: RE | Admit: 2020-02-21 | Discharge: 2020-02-21 | Disposition: A | Discharge status: Home / Self Care | Payer: Commercial Managed Care - PPO | Source: Ambulatory Visit | Attending: Family Medicine | Admitting: Family Medicine

## 2020-02-21 ENCOUNTER — Other Ambulatory Visit: Payer: Self-pay

## 2020-02-21 DIAGNOSIS — R634 Abnormal weight loss: Secondary | ICD-10-CM

## 2020-02-21 MED ORDER — IOPAMIDOL (ISOVUE-300) INJECTION 61%
100.0000 mL | Freq: Once | INTRAVENOUS | Status: AC | PRN
  Administered 2020-02-21: 100 mL via INTRAVENOUS

## 2020-03-10 ENCOUNTER — Encounter (HOSPITAL_COMMUNITY): Payer: Self-pay | Admitting: Cardiology

## 2020-03-20 ENCOUNTER — Telehealth (HOSPITAL_COMMUNITY): Payer: Self-pay | Admitting: Cardiology

## 2020-03-20 NOTE — Telephone Encounter (Signed)
Just an FYI. We have made several attempts to contact this patient including sending a letter to schedule or reschedule their Stress Echocardiogram. We will be removing the patient from the echo WQ.   03/10/20 Mailed letter [facility]  03/06/20 [facility] to reschedule @ 1:54/[facility]  02/19/2020 [facility] to schedule [facility] 11:15  patient cancelled 02/19/20/[facility]     Thank you

## 2020-03-24 ENCOUNTER — Ambulatory Visit: Payer: Commercial Managed Care - PPO | Admitting: Cardiology

## 2020-04-14 ENCOUNTER — Ambulatory Visit: Payer: Commercial Managed Care - PPO | Admitting: Cardiology

## 2021-07-08 IMAGING — CR DG CHEST 2V
2 series · 2 of 2 positions shown · non-contrast
Comparison: 11/11/2015

CLINICAL DATA: Shortness of breath

EXAM:
CHEST - 2 VIEW

[chest pa]
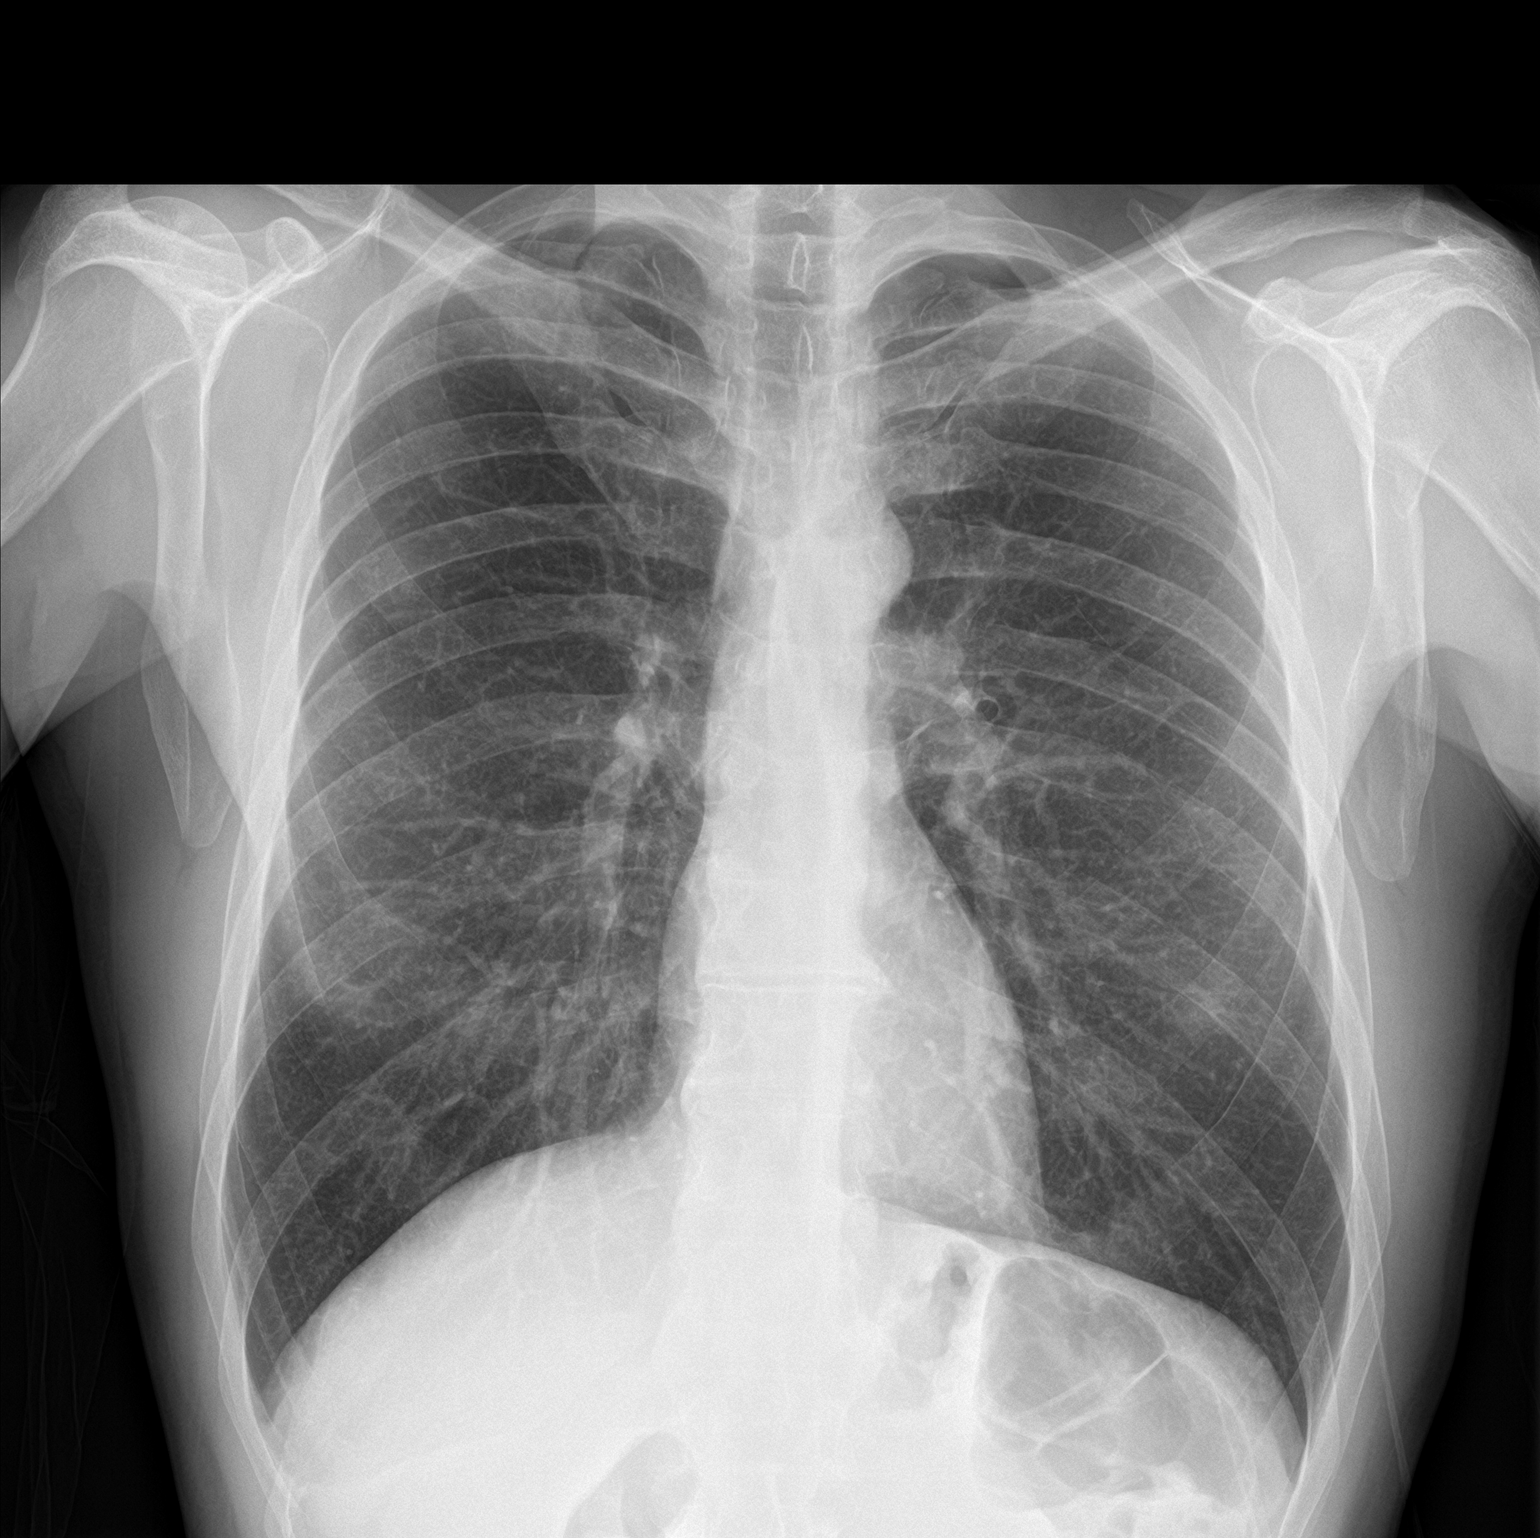

[chest lat]
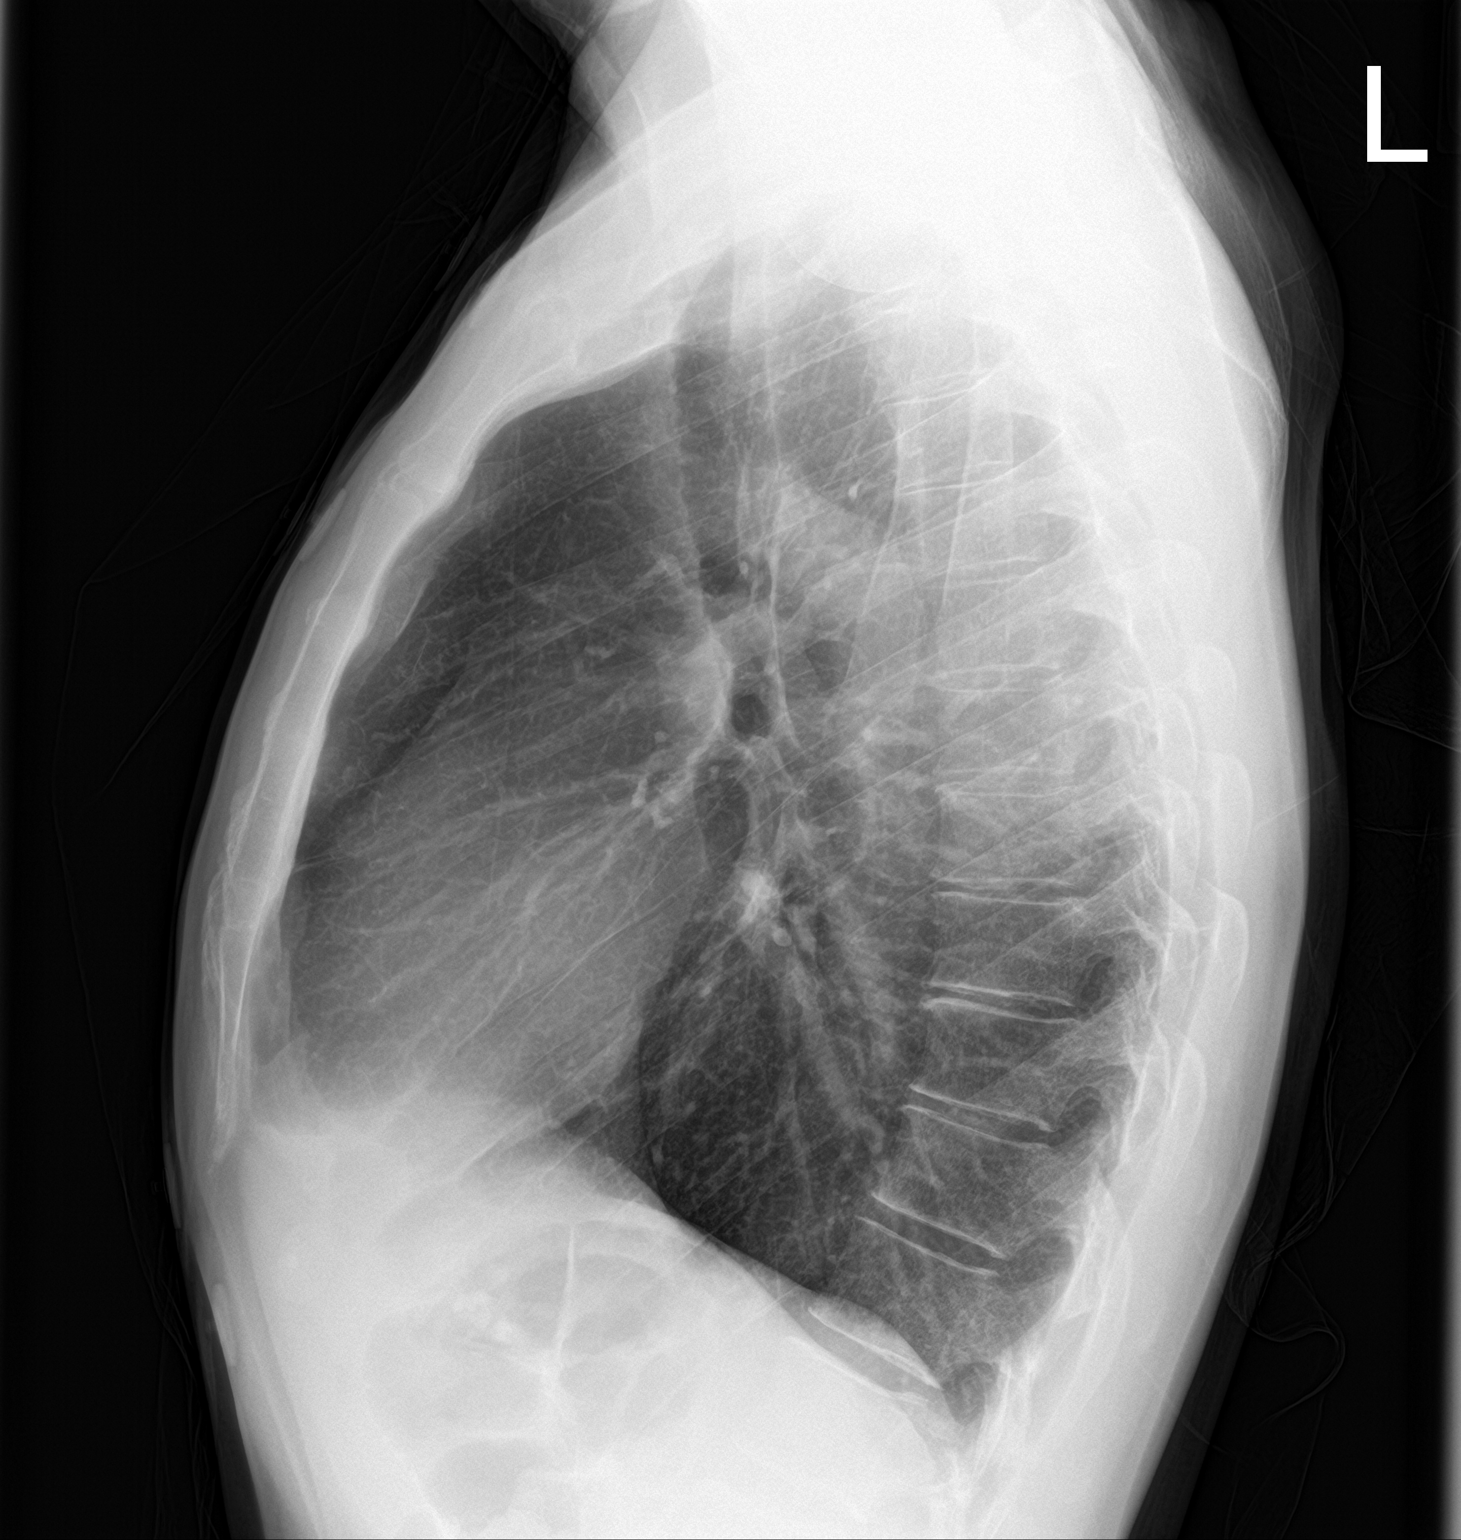

[2 of 2 positions shown; findings below may reference images not displayed]

FINDINGS: Pulmonary hyperinflation. No focal opacity or pleural effusion.
Normal heart size. No pneumothorax.
IMPRESSION: No active cardiopulmonary disease.

## 2021-11-25 ENCOUNTER — Other Ambulatory Visit (HOSPITAL_BASED_OUTPATIENT_CLINIC_OR_DEPARTMENT_OTHER): Payer: Self-pay | Admitting: Family Medicine

## 2021-11-25 DIAGNOSIS — E782 Mixed hyperlipidemia: Secondary | ICD-10-CM

## 2021-11-26 ENCOUNTER — Encounter (HOSPITAL_COMMUNITY): Payer: Self-pay

## 2021-12-11 ENCOUNTER — Ambulatory Visit (HOSPITAL_BASED_OUTPATIENT_CLINIC_OR_DEPARTMENT_OTHER)
Admission: RE | Admit: 2021-12-11 | Discharge: 2021-12-11 | Disposition: A | Discharge status: Home / Self Care | Payer: Commercial Managed Care - PPO | Source: Ambulatory Visit | Attending: Family Medicine | Admitting: Family Medicine

## 2021-12-11 ENCOUNTER — Other Ambulatory Visit: Payer: Self-pay

## 2021-12-11 DIAGNOSIS — E782 Mixed hyperlipidemia: Secondary | ICD-10-CM | POA: Insufficient documentation
